# Patient Record
Sex: Female | Born: 1950 | Race: White | Hispanic: No | Marital: Married | Smoking: Former smoker
Health system: Southern US, Community
[De-identification: ages and names within clinical notes are randomized; demographics above are authoritative.]

## PROBLEM LIST (undated history)

## (undated) DIAGNOSIS — M1990 Unspecified osteoarthritis, unspecified site: Secondary | ICD-10-CM

## (undated) DIAGNOSIS — E785 Hyperlipidemia, unspecified: Secondary | ICD-10-CM

## (undated) DIAGNOSIS — N952 Postmenopausal atrophic vaginitis: Secondary | ICD-10-CM

## (undated) DIAGNOSIS — I10 Essential (primary) hypertension: Secondary | ICD-10-CM

## (undated) DIAGNOSIS — M8580 Other specified disorders of bone density and structure, unspecified site: Secondary | ICD-10-CM

## (undated) DIAGNOSIS — I493 Ventricular premature depolarization: Secondary | ICD-10-CM

## (undated) DIAGNOSIS — R011 Cardiac murmur, unspecified: Secondary | ICD-10-CM

## (undated) DIAGNOSIS — Z8669 Personal history of other diseases of the nervous system and sense organs: Secondary | ICD-10-CM

## (undated) DIAGNOSIS — M810 Age-related osteoporosis without current pathological fracture: Secondary | ICD-10-CM

## (undated) DIAGNOSIS — F419 Anxiety disorder, unspecified: Secondary | ICD-10-CM

## (undated) DIAGNOSIS — F4321 Adjustment disorder with depressed mood: Secondary | ICD-10-CM

## (undated) HISTORY — DX: Personal history of other diseases of the nervous system and sense organs: Z86.69

## (undated) HISTORY — DX: Hyperlipidemia, unspecified: E78.5

## (undated) HISTORY — DX: Adjustment disorder with depressed mood: F43.21

## (undated) HISTORY — DX: Other specified disorders of bone density and structure, unspecified site: M85.80

## (undated) HISTORY — DX: Unspecified osteoarthritis, unspecified site: M19.90

## (undated) HISTORY — DX: Essential (primary) hypertension: I10

## (undated) HISTORY — DX: Postmenopausal atrophic vaginitis: N95.2

## (undated) HISTORY — DX: Cardiac murmur, unspecified: R01.1

## (undated) HISTORY — DX: Anxiety disorder, unspecified: F41.9

## (undated) HISTORY — DX: Ventricular premature depolarization: I49.3

## (undated) HISTORY — DX: Age-related osteoporosis without current pathological fracture: M81.0

## (undated) HISTORY — PX: EYE SURGERY: SHX253

---
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
## 2009-06-30 DIAGNOSIS — F4321 Adjustment disorder with depressed mood: Secondary | ICD-10-CM

## 2009-06-30 HISTORY — DX: Adjustment disorder with depressed mood: F43.21

## 2011-12-29 HISTORY — PX: COLONOSCOPY: SHX174

## 2012-01-09 LAB — HM COLONOSCOPY: HM COLON: NORMAL

## 2012-09-28 DIAGNOSIS — M8580 Other specified disorders of bone density and structure, unspecified site: Secondary | ICD-10-CM

## 2012-09-28 HISTORY — DX: Other specified disorders of bone density and structure, unspecified site: M85.80

## 2012-09-28 HISTORY — PX: OTHER SURGICAL HISTORY: SHX169

## 2012-10-15 LAB — HM DEXA SCAN

## 2013-04-29 LAB — COMPREHENSIVE METABOLIC PANEL
ALT: 15
AST: 16 U/L
Alkaline Phosphatase: 45 U/L
Creat: 0.82
Glucose: 85
Potassium: 4.2 mmol/L
Sodium: 139 mmol/L (ref 137–147)
Total Bilirubin: 0.6 mg/dL

## 2013-04-29 LAB — LIPID PANEL
CHOLESTEROL, TOTAL: 164
HDL: 58 mg/dL (ref 35–70)
LDL (calc): 84
Triglycerides: 109

## 2013-04-29 LAB — TSH: TSH: 1.577

## 2013-04-29 LAB — CBC
HEMOGLOBIN: 13.2 g/dL
PLATELET COUNT: 300
WBC: 4.4

## 2013-05-11 ENCOUNTER — Other Ambulatory Visit: Payer: Self-pay | Admitting: Nurse Practitioner

## 2013-05-11 DIAGNOSIS — Z1231 Encounter for screening mammogram for malignant neoplasm of breast: Secondary | ICD-10-CM

## 2013-05-27 ENCOUNTER — Ambulatory Visit
Admission: RE | Admit: 2013-05-27 | Discharge: 2013-05-27 | Disposition: A | Discharge status: Home / Self Care | Payer: Federal, State, Local not specified - PPO | Source: Ambulatory Visit | Attending: Nurse Practitioner | Admitting: Nurse Practitioner

## 2013-05-27 DIAGNOSIS — Z1231 Encounter for screening mammogram for malignant neoplasm of breast: Secondary | ICD-10-CM

## 2014-05-01 ENCOUNTER — Ambulatory Visit: Payer: Federal, State, Local not specified - PPO | Admitting: Family Medicine

## 2014-05-04 ENCOUNTER — Ambulatory Visit (INDEPENDENT_AMBULATORY_CARE_PROVIDER_SITE_OTHER): Payer: Federal, State, Local not specified - PPO

## 2014-05-04 ENCOUNTER — Encounter: Payer: Self-pay | Admitting: Family Medicine

## 2014-05-04 ENCOUNTER — Ambulatory Visit (INDEPENDENT_AMBULATORY_CARE_PROVIDER_SITE_OTHER): Payer: Federal, State, Local not specified - PPO | Admitting: Family Medicine

## 2014-05-04 ENCOUNTER — Encounter (INDEPENDENT_AMBULATORY_CARE_PROVIDER_SITE_OTHER): Payer: Self-pay

## 2014-05-04 VITALS — BP 116/70 | HR 75 | Temp 99.0°F | Resp 14 | Ht 66.5 in | Wt 132.0 lb

## 2014-05-04 DIAGNOSIS — E785 Hyperlipidemia, unspecified: Secondary | ICD-10-CM

## 2014-05-04 DIAGNOSIS — I10 Essential (primary) hypertension: Secondary | ICD-10-CM

## 2014-05-04 DIAGNOSIS — M8580 Other specified disorders of bone density and structure, unspecified site: Secondary | ICD-10-CM

## 2014-05-04 DIAGNOSIS — Z23 Encounter for immunization: Secondary | ICD-10-CM

## 2014-05-04 MED ORDER — PRAVASTATIN SODIUM 40 MG PO TABS: 40.0000 mg | ORAL_TABLET | Freq: Every day | ORAL | Status: DC

## 2014-05-04 NOTE — Progress Notes (Signed)
Pre visit review using our clinic review tool, if applicable. No additional management support is needed unless otherwise documented below in the visit note. 

## 2014-05-04 NOTE — Assessment & Plan Note (Signed)
Very well controlled on pravastatin 40mg  daily based on last [place] 2014. Return fasting for recheck. Mainly taking 2/2 fam hx.

## 2014-05-04 NOTE — Assessment & Plan Note (Signed)
Chronic, very stable on current amlodipine 5mg  dose. Suggested decrease to 2.5mg  (pt will take 1/2 tab and monitor bp at home.)

## 2014-05-04 NOTE — Assessment & Plan Note (Signed)
Pt will check on latest DEXA and bring me copy (thinks 2013). Continues cal/vit d BID. S/p 5 yrs fosamax.

## 2014-05-04 NOTE — Progress Notes (Signed)
BP 116/70 mmHg  Pulse 75  Temp(Src) 99 F (37.2 C) (Oral)  Resp 14  Ht 5' 6.5" (1.689 m)  Wt 132 lb (59.875 kg)  BMI 20.99 kg/m2  SpO2 97%   CC: new pt to establish  Subjective:    Patient ID: Sheri Patel, female    DOB: 1950/11/17, 63 y.o.   MRN: [id number]  HPI: Sheri Patel is a 63 y.o. female presenting on 05/04/2014 for [place]   Recently moved from [place] with husband to be closer to grandchildren.  HTN - compliant with amlodipine 5mg  daily. [facility]. Notices bp dropping some wonders if still needs 5mg . No HA, vision changes, CP/tightness, SOB, leg swelling.  HLD - compliant with pravastatin 40mg  daily without myalgias. Brings labs from 04/2013 showing LFTs WNL, TC 164, trig 109, HDL 58, and LDL 84  H/o osteopenia - was on bisphosphonate for 5 years. Last dexa thinks 2013. Has lost 1.5 inches.  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  Preventative: Last blood work 1 yr ago Colonoscopy at age 89 and 32, normal (last 2012) Well woman exam 12/2012 GYN Dr Sheran Fava.  Would like to have GYN done at our office. 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  Allergies and medications reviewed and updated. No current outpatient prescriptions on file prior to visit.   No current facility-administered medications on file prior to visit.    Review of Systems Per HPI unless specifically indicated above    Objective:    BP 116/70 mmHg  Pulse 75  Temp(Src) 99 F (37.2 C) (Oral)  Resp 14  Ht 5' 6.5" (1.689 m)  Wt 132 lb (59.875 kg)  BMI 20.99 kg/m2  SpO2 97%  Physical Exam  Constitutional: She  appears well-developed and well-nourished. No distress.  HENT:  Head: Normocephalic and atraumatic.  Mouth/Throat: Oropharynx is clear and moist. No oropharyngeal exudate.  Eyes: Conjunctivae and EOM are normal. Pupils are equal, round, and reactive to light. No scleral icterus.  Neck: Normal range of motion. Neck supple. No thyromegaly present.  Cardiovascular: Normal rate, regular rhythm, normal heart sounds and intact distal pulses.   No murmur heard. Pulmonary/Chest: Effort normal and breath sounds normal. No respiratory distress. She has no wheezes. She has no rales.  Musculoskeletal: She exhibits no edema.  Lymphadenopathy:    She has no cervical adenopathy.  Skin: Skin is warm and dry. No rash noted.  Psychiatric: She has a normal mood and affect.  Nursing note and vitals reviewed.  No results found for this or any previous visit.    Assessment & Plan:   Problem List Items Addressed This Visit    Osteopenia    Pt will check on latest DEXA and bring me copy (thinks 2013). Continues cal/vit d BID. S/p 5 yrs fosamax.    Relevant Orders      Vit D  25 hydroxy (rtn osteoporosis monitoring)   Hypertension - Primary    Chronic, very stable on current amlodipine 5mg  dose. Suggested decrease to 2.5mg  (pt will take 1/2 tab and monitor bp at home.)    Relevant Medications      amLODipine (NORVASC) 5 MG  tablet      aspirin 81 MG tablet      pravastatin (PRAVACHOL) tablet   Other Relevant Orders      Comprehensive metabolic panel   Hyperlipidemia    Very well controlled on pravastatin 40mg  daily based on last [place] 2014. Return fasting for recheck. Mainly taking 2/2 fam hx.    Relevant Medications      amLODipine (NORVASC) 5 MG tablet      aspirin 81 MG tablet      pravastatin (PRAVACHOL) tablet   Other Relevant Orders      Lipid panel      Comprehensive metabolic panel       Follow up plan: Return in about 3 months (around 08/04/2014), or as needed, for annual exam, prior  fasting for blood work.

## 2014-05-04 NOTE — Patient Instructions (Addendum)
Flu shot today. Try lower amlodipine dose (1/2 tablet daily) and monitor blood pressures. Return as needed or in 3-4 months for physical.  Check on bone density scan at home. Good to meet you today, call us with questions.

## 2014-05-05 ENCOUNTER — Telehealth: Payer: Self-pay | Admitting: Family Medicine

## 2014-05-05 NOTE — Telephone Encounter (Signed)
emmi emailed °

## 2014-05-10 ENCOUNTER — Encounter: Payer: Self-pay | Admitting: *Deleted

## 2014-06-13 ENCOUNTER — Encounter: Payer: Self-pay | Admitting: Family Medicine

## 2014-06-21 ENCOUNTER — Telehealth: Payer: Self-pay

## 2014-06-21 NOTE — Telephone Encounter (Signed)
Pt saw Dr Darnell Level on 05/04/14 and Amlodipine 5 mg was decreased to 1/2 tab daily. Pt has been taking 1/2 tab daily and BP averaging 115/78. Pt request new rx of amlodipine 2.5 mg taking one daily to [facility].Please advise.

## 2014-06-22 MED ORDER — AMLODIPINE BESYLATE 2.5 MG PO TABS: 2.5000 mg | ORAL_TABLET | Freq: Every day | ORAL | Status: DC

## 2014-06-22 NOTE — Telephone Encounter (Signed)
Sent. Thanks.   

## 2014-07-05 ENCOUNTER — Other Ambulatory Visit: Payer: Self-pay

## 2014-07-05 DIAGNOSIS — Z1231 Encounter for screening mammogram for malignant neoplasm of breast: Secondary | ICD-10-CM

## 2014-07-06 ENCOUNTER — Encounter: Payer: Self-pay | Admitting: *Deleted

## 2014-07-06 ENCOUNTER — Ambulatory Visit
Admission: RE | Admit: 2014-07-06 | Discharge: 2014-07-06 | Disposition: A | Discharge status: Home / Self Care | Payer: Federal, State, Local not specified - PPO | Source: Ambulatory Visit

## 2014-07-06 DIAGNOSIS — Z1231 Encounter for screening mammogram for malignant neoplasm of breast: Secondary | ICD-10-CM

## 2014-07-06 LAB — HM MAMMOGRAPHY: HM MAMMO: NORMAL

## 2014-07-24 ENCOUNTER — Encounter: Payer: Self-pay | Admitting: Family Medicine

## 2014-08-07 ENCOUNTER — Other Ambulatory Visit: Payer: Federal, State, Local not specified - PPO

## 2014-08-08 ENCOUNTER — Other Ambulatory Visit (INDEPENDENT_AMBULATORY_CARE_PROVIDER_SITE_OTHER): Payer: Federal, State, Local not specified - PPO

## 2014-08-08 DIAGNOSIS — E785 Hyperlipidemia, unspecified: Secondary | ICD-10-CM

## 2014-08-08 DIAGNOSIS — I10 Essential (primary) hypertension: Secondary | ICD-10-CM

## 2014-08-08 DIAGNOSIS — M8580 Other specified disorders of bone density and structure, unspecified site: Secondary | ICD-10-CM

## 2014-08-08 LAB — COMPREHENSIVE METABOLIC PANEL
ALBUMIN: 4.2 g/dL (ref 3.5–5.2)
ALT: 15 U/L (ref 0–35)
AST: 15 U/L (ref 0–37)
Alkaline Phosphatase: 57 U/L (ref 39–117)
BILIRUBIN TOTAL: 0.5 mg/dL (ref 0.2–1.2)
BUN: 17 mg/dL (ref 6–23)
CO2: 31 meq/L (ref 19–32)
Calcium: 9.5 mg/dL (ref 8.4–10.5)
Chloride: 103 mEq/L (ref 96–112)
Creatinine, Ser: 0.95 mg/dL (ref 0.40–1.20)
GFR: 63.04 mL/min (ref >60.00)
GLUCOSE: 95 mg/dL (ref 70–99)
Potassium: 4 mEq/L (ref 3.5–5.1)
SODIUM: 140 meq/L (ref 135–145)
TOTAL PROTEIN: 6.9 g/dL (ref 6.0–8.3)

## 2014-08-08 LAB — LIPID PANEL
Cholesterol: 162 mg/dL (ref 0–200)
HDL: 65.3 mg/dL (ref >39.00)
LDL Cholesterol: 80 mg/dL (ref 0–99)
NONHDL: 96.7
Total CHOL/HDL Ratio: 2
Triglycerides: 84 mg/dL (ref 0.0–149.0)
VLDL: 16.8 mg/dL (ref 0.0–40.0)

## 2014-08-08 LAB — VITAMIN D 25 HYDROXY (VIT D DEFICIENCY, FRACTURES): VITD: 55.51 ng/mL (ref 30.00–100.00)

## 2014-08-14 ENCOUNTER — Ambulatory Visit (INDEPENDENT_AMBULATORY_CARE_PROVIDER_SITE_OTHER): Payer: Federal, State, Local not specified - PPO | Admitting: Family Medicine

## 2014-08-14 ENCOUNTER — Encounter: Payer: Self-pay | Admitting: Family Medicine

## 2014-08-14 ENCOUNTER — Other Ambulatory Visit (HOSPITAL_COMMUNITY)
Admission: RE | Admit: 2014-08-14 | Discharge: 2014-08-14 | Disposition: A | Discharge status: Home / Self Care | Payer: Federal, State, Local not specified - PPO | Source: Ambulatory Visit | Attending: Family Medicine | Admitting: Family Medicine

## 2014-08-14 VITALS — BP 126/84 | HR 64 | Temp 99.0°F | Ht 66.5 in | Wt 134.5 lb

## 2014-08-14 DIAGNOSIS — I10 Essential (primary) hypertension: Secondary | ICD-10-CM

## 2014-08-14 DIAGNOSIS — Z1151 Encounter for screening for human papillomavirus (HPV): Secondary | ICD-10-CM | POA: Diagnosis present

## 2014-08-14 DIAGNOSIS — Z124 Encounter for screening for malignant neoplasm of cervix: Secondary | ICD-10-CM

## 2014-08-14 DIAGNOSIS — Z01419 Encounter for gynecological examination (general) (routine) without abnormal findings: Secondary | ICD-10-CM | POA: Insufficient documentation

## 2014-08-14 DIAGNOSIS — M8580 Other specified disorders of bone density and structure, unspecified site: Secondary | ICD-10-CM

## 2014-08-14 DIAGNOSIS — Z8742 Personal history of other diseases of the female genital tract: Secondary | ICD-10-CM | POA: Insufficient documentation

## 2014-08-14 DIAGNOSIS — Z0000 Encounter for general adult medical examination without abnormal findings: Secondary | ICD-10-CM | POA: Insufficient documentation

## 2014-08-14 DIAGNOSIS — E785 Hyperlipidemia, unspecified: Secondary | ICD-10-CM

## 2014-08-14 LAB — HM PAP SMEAR: HM Pap smear: NORMAL

## 2014-08-14 NOTE — Progress Notes (Signed)
BP 126/84 mmHg  Pulse 64  Temp(Src) 99 F (37.2 C) (Oral)  Ht 5' 6.5" (1.689 m)  Wt 134 lb 8 oz (61.009 kg)  BMI 21.39 kg/m2   CC: CPE  Subjective:    Patient ID: Sheri Patel, female    DOB: 05-06-1951, 64 y.o.   MRN: [id number]  HPI: Sheri Patel is a 64 y.o. female presenting on 08/14/2014 for Annual Exam   Recently returned from [place] for 2 weeks.   Preventative: COLONOSCOPY Date: 12/2011 normal ([facility] in [place]) DEXA Date: 09/2012 T -1.9 at spine, -0.7 at hip, was on fosamax for 5 yrs, stopped 10/2013. Consider repeat next physical. Well woman exam 12/2012 GYN Dr Sheran Fava. Would like to have GYN done at our office. H/o abnormals in the past. 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 Interim medical history since our last visit reviewed. Allergies and medications reviewed and updated. Current Outpatient Prescriptions on File Prior to Visit  Medication Sig  . amLODipine (NORVASC) 2.5 MG tablet Take 1 tablet (2.5 mg total) by mouth daily.  Mar[place] Kitchen aspirin 81 MG tablet Take 81 mg by mouth every Monday, Wednesday, and Friday.   . Calcium Carb-Cholecalciferol (CALCIUM-VITAMIN D3) 600-400 MG-UNIT CAPS Take 1 capsule by mouth 2 (two) times daily.  . cycloSPORINE (RESTASIS) 0.05 % ophthalmic emulsion Place 1 drop into both eyes 2 (two) times daily.  . pravastatin (PRAVACHOL) 40 MG tablet Take 1 tablet (40 mg total) by mouth at bedtime.   No current facility-administered medications on file prior to visit.    Review of Systems  Constitutional: Negative for fever, chills, activity change, appetite change, fatigue and unexpected weight change.  HENT:  Negative for hearing loss.   Eyes: Negative for visual disturbance.  Respiratory: Negative for cough, chest tightness, shortness of breath and wheezing.   Cardiovascular: Negative for chest pain, palpitations and leg swelling.  Gastrointestinal: Negative for nausea, vomiting, abdominal pain, diarrhea, constipation, blood in stool and abdominal distention.  Genitourinary: Negative for hematuria and difficulty urinating.  Musculoskeletal: Negative for myalgias, arthralgias and neck pain.  Skin: Negative for rash.  Neurological: Negative for dizziness, seizures, syncope and headaches.  Hematological: Negative for adenopathy. Does not bruise/bleed easily.  Psychiatric/Behavioral: Negative for dysphoric mood. The patient is not nervous/anxious.    Per HPI unless specifically indicated above     Objective:    BP 126/84 mmHg  Pulse 64  Temp(Src) 99 F (37.2 C) (Oral)  Ht 5' 6.5" (1.689 m)  Wt 134 lb 8 oz (61.009 kg)  BMI 21.39 kg/m2  Wt Readings from Last 3 Encounters:  08/14/14 134 lb 8 oz (61.009 kg)  05/04/14 132 lb (59.875 kg)    Physical Exam  Constitutional: She is oriented to person, place, and time. She appears well-developed and well-nourished. No distress.  HENT:  Head: Normocephalic and atraumatic.  Right Ear: Hearing, tympanic membrane, external ear and ear canal normal.  Left Ear: Hearing, tympanic membrane, external ear and ear canal normal.  Nose: Nose normal.  Mouth/Throat: Uvula is midline, oropharynx is clear and moist and mucous membranes are normal. No oropharyngeal exudate, posterior oropharyngeal edema or posterior oropharyngeal erythema.  Eyes: Conjunctivae and EOM are normal. Pupils are equal, round,  and reactive to light. No scleral icterus.  Neck: Normal range of motion. Neck supple. Carotid bruit is not present. No thyromegaly present.  Cardiovascular: Normal rate, regular rhythm, normal heart sounds and intact distal pulses.   No murmur heard. Pulses:       Radial pulses are 2+ on the right side, and 2+ on the left side.  Pulmonary/Chest: Effort normal and breath sounds normal. No respiratory distress. She has no wheezes. She has no rales. Right breast exhibits no inverted nipple, no mass, no nipple discharge and no skin change. Left breast exhibits no inverted nipple, no mass, no nipple discharge and no skin change.  Abdominal: Soft. Bowel sounds are normal. She exhibits no distension and no mass. There is no tenderness. There is no rebound and no guarding.  Genitourinary: Vagina normal and uterus normal. Pelvic exam was performed with patient supine. There is no rash, tenderness, lesion or injury on the right labia. There is no rash, tenderness, lesion or injury on the left labia. Cervix exhibits friability. Cervix exhibits no motion tenderness. Right adnexum displays no mass, no tenderness and no fullness. Left adnexum displays no mass, no tenderness and no fullness.  Pap performed  Musculoskeletal: Normal range of motion. She exhibits no edema.  Lymphadenopathy:       Head (right side): No submental, no submandibular, no tonsillar, no preauricular and no posterior auricular adenopathy present.       Head (left side): No submental, no submandibular, no tonsillar, no preauricular and no posterior auricular adenopathy present.    She has no cervical adenopathy.    She has no axillary adenopathy.       Right axillary: No lateral adenopathy present.       Left axillary: No lateral adenopathy present.      Right: No supraclavicular adenopathy present.       Left: No supraclavicular adenopathy present.  Neurological: She is alert and oriented to person, place, and time.  CN grossly intact, station and gait intact  Skin: Skin is warm and dry. No rash noted.  Psychiatric: She has a normal mood and affect. Her behavior is normal. Judgment and thought content normal.  Nursing note and vitals reviewed.  Results for orders placed or performed in visit on  08/14/14  HM DEXA SCAN  Result Value Ref Range   HM Dexa Scan osteopenia       Assessment & Plan:   Problem List Items Addressed This Visit    Osteopenia    Reviewed #s. Check DEXA next year (2017). She does have h/o bisphosphonate use x 5 years, stopped 10/2013. She does regularly take calcium/vit D and participates in regular weight bearing exercises.      Hypertension    Chronic, stable. Continue lower dose amlodipine 2.5mg  daily regimen.      Hyperlipidemia    Well controlled on pravastatin 40mg  daily. Continue. Strong fmhx CAD.      History of abnormal cervical Pap smear    Recheck today.      Health maintenance examination - Primary    Preventative protocols reviewed and updated unless pt declined. Discussed healthy diet and lifestyle.  Breast exam, pelvic and pap smear today. H/o abnormals.          Follow up plan: Return in about 1 year (around 08/15/2015), or as needed, for annual exam, prior fasting for blood work.

## 2014-08-14 NOTE — Assessment & Plan Note (Signed)
Recheck today. 

## 2014-08-14 NOTE — Assessment & Plan Note (Addendum)
Well controlled on pravastatin 40mg  daily. Continue. Strong fmhx CAD.

## 2014-08-14 NOTE — Progress Notes (Signed)
Pre visit review using our clinic review tool, if applicable. No additional management support is needed unless otherwise documented below in the visit note. 

## 2014-08-14 NOTE — Assessment & Plan Note (Addendum)
Chronic, stable. Continue lower dose amlodipine 2.5mg  daily regimen.

## 2014-08-14 NOTE — Assessment & Plan Note (Signed)
Reviewed #s. Check DEXA next year (2017). She does have h/o bisphosphonate use x 5 years, stopped 10/2013. She does regularly take calcium/vit D and participates in regular weight bearing exercises.

## 2014-08-14 NOTE — Assessment & Plan Note (Addendum)
Preventative protocols reviewed and updated unless pt declined. Discussed healthy diet and lifestyle.  Breast exam, pelvic and pap smear today. H/o abnormals.

## 2014-08-14 NOTE — Addendum Note (Signed)
Addended by: Royann Shivers A on: 08/14/2014 10:59 AM   Modules accepted: Orders

## 2014-08-14 NOTE — Patient Instructions (Signed)
Good to see you today, call us with questions. Return as needed or in 1 year for next physical.  Sign release for records from prior GYN and prior pap smears up front. Continue calcium and vitamin D. We will discuss repeat DEXA next year. Continue weight bearing exercises.

## 2014-08-16 LAB — CYTOLOGY - PAP

## 2014-08-17 ENCOUNTER — Encounter: Payer: Self-pay | Admitting: *Deleted

## 2014-08-29 DIAGNOSIS — N952 Postmenopausal atrophic vaginitis: Secondary | ICD-10-CM

## 2014-08-29 HISTORY — DX: Postmenopausal atrophic vaginitis: N95.2

## 2014-10-02 ENCOUNTER — Encounter: Payer: Self-pay | Admitting: Family Medicine

## 2014-11-12 ENCOUNTER — Encounter: Payer: Self-pay | Admitting: Family Medicine

## 2014-11-12 DIAGNOSIS — Z7189 Other specified counseling: Secondary | ICD-10-CM | POA: Insufficient documentation

## 2015-01-19 ENCOUNTER — Other Ambulatory Visit: Payer: Self-pay | Admitting: Family Medicine

## 2015-07-02 ENCOUNTER — Other Ambulatory Visit: Payer: Self-pay | Admitting: Family Medicine

## 2015-07-16 ENCOUNTER — Other Ambulatory Visit: Payer: Self-pay

## 2015-07-16 DIAGNOSIS — Z1231 Encounter for screening mammogram for malignant neoplasm of breast: Secondary | ICD-10-CM

## 2015-08-02 ENCOUNTER — Ambulatory Visit
Admission: RE | Admit: 2015-08-02 | Discharge: 2015-08-02 | Disposition: A | Discharge status: Home / Self Care | Payer: Federal, State, Local not specified - PPO | Source: Ambulatory Visit

## 2015-08-02 DIAGNOSIS — Z1231 Encounter for screening mammogram for malignant neoplasm of breast: Secondary | ICD-10-CM

## 2015-08-03 LAB — HM MAMMOGRAPHY: HM MAMMO: NORMAL

## 2015-08-06 ENCOUNTER — Encounter: Payer: Self-pay | Admitting: *Deleted

## 2015-08-07 ENCOUNTER — Other Ambulatory Visit: Payer: Self-pay | Admitting: *Deleted

## 2015-08-07 MED ORDER — AMLODIPINE BESYLATE 2.5 MG PO TABS: 2.5000 mg | ORAL_TABLET | Freq: Every day | ORAL | Status: DC

## 2015-08-14 ENCOUNTER — Other Ambulatory Visit: Payer: Self-pay | Admitting: Family Medicine

## 2015-08-16 ENCOUNTER — Other Ambulatory Visit: Payer: Federal, State, Local not specified - PPO

## 2015-08-20 ENCOUNTER — Encounter: Payer: Federal, State, Local not specified - PPO | Admitting: Family Medicine

## 2015-09-05 ENCOUNTER — Other Ambulatory Visit: Payer: Self-pay | Admitting: Family Medicine

## 2015-09-05 DIAGNOSIS — I10 Essential (primary) hypertension: Secondary | ICD-10-CM

## 2015-09-05 DIAGNOSIS — E785 Hyperlipidemia, unspecified: Secondary | ICD-10-CM

## 2015-09-06 ENCOUNTER — Other Ambulatory Visit (INDEPENDENT_AMBULATORY_CARE_PROVIDER_SITE_OTHER): Payer: Federal, State, Local not specified - PPO

## 2015-09-06 DIAGNOSIS — I10 Essential (primary) hypertension: Secondary | ICD-10-CM

## 2015-09-06 DIAGNOSIS — E785 Hyperlipidemia, unspecified: Secondary | ICD-10-CM

## 2015-09-06 LAB — BASIC METABOLIC PANEL
BUN: 18 mg/dL (ref 6–23)
CALCIUM: 9.5 mg/dL (ref 8.4–10.5)
CO2: 31 meq/L (ref 19–32)
CREATININE: 0.79 mg/dL (ref 0.40–1.20)
Chloride: 104 mEq/L (ref 96–112)
GFR: 77.73 mL/min (ref >60.00)
GLUCOSE: 100 mg/dL — AB (ref 70–99)
Potassium: 4.1 mEq/L (ref 3.5–5.1)
Sodium: 141 mEq/L (ref 135–145)

## 2015-09-06 LAB — LIPID PANEL
CHOLESTEROL: 174 mg/dL (ref 0–200)
HDL: 68.9 mg/dL (ref >39.00)
LDL Cholesterol: 84 mg/dL (ref 0–99)
NonHDL: 105.24
TRIGLYCERIDES: 105 mg/dL (ref 0.0–149.0)
Total CHOL/HDL Ratio: 3
VLDL: 21 mg/dL (ref 0.0–40.0)

## 2015-09-06 LAB — HEPATIC FUNCTION PANEL
ALBUMIN: 4.4 g/dL (ref 3.5–5.2)
ALK PHOS: 48 U/L (ref 39–117)
ALT: 15 U/L (ref 0–35)
AST: 15 U/L (ref 0–37)
Bilirubin, Direct: 0.1 mg/dL (ref 0.0–0.3)
Total Bilirubin: 0.4 mg/dL (ref 0.2–1.2)
Total Protein: 7 g/dL (ref 6.0–8.3)

## 2015-09-13 ENCOUNTER — Encounter: Payer: Self-pay | Admitting: Family Medicine

## 2015-09-13 ENCOUNTER — Ambulatory Visit (INDEPENDENT_AMBULATORY_CARE_PROVIDER_SITE_OTHER): Payer: Federal, State, Local not specified - PPO | Admitting: Family Medicine

## 2015-09-13 ENCOUNTER — Other Ambulatory Visit (HOSPITAL_COMMUNITY)
Admission: RE | Admit: 2015-09-13 | Discharge: 2015-09-13 | Disposition: A | Discharge status: Home / Self Care | Payer: Federal, State, Local not specified - PPO | Source: Ambulatory Visit | Attending: Family Medicine | Admitting: Family Medicine

## 2015-09-13 VITALS — BP 126/84 | HR 68 | Temp 98.7°F | Wt 132.8 lb

## 2015-09-13 DIAGNOSIS — M8580 Other specified disorders of bone density and structure, unspecified site: Secondary | ICD-10-CM | POA: Diagnosis not present

## 2015-09-13 DIAGNOSIS — I358 Other nonrheumatic aortic valve disorders: Secondary | ICD-10-CM | POA: Insufficient documentation

## 2015-09-13 DIAGNOSIS — Z0000 Encounter for general adult medical examination without abnormal findings: Secondary | ICD-10-CM

## 2015-09-13 DIAGNOSIS — R011 Cardiac murmur, unspecified: Secondary | ICD-10-CM

## 2015-09-13 DIAGNOSIS — I350 Nonrheumatic aortic (valve) stenosis: Secondary | ICD-10-CM | POA: Insufficient documentation

## 2015-09-13 DIAGNOSIS — I10 Essential (primary) hypertension: Secondary | ICD-10-CM | POA: Diagnosis not present

## 2015-09-13 DIAGNOSIS — Z124 Encounter for screening for malignant neoplasm of cervix: Secondary | ICD-10-CM

## 2015-09-13 DIAGNOSIS — Z01419 Encounter for gynecological examination (general) (routine) without abnormal findings: Secondary | ICD-10-CM | POA: Insufficient documentation

## 2015-09-13 DIAGNOSIS — E785 Hyperlipidemia, unspecified: Secondary | ICD-10-CM

## 2015-09-13 DIAGNOSIS — Z7189 Other specified counseling: Secondary | ICD-10-CM

## 2015-09-13 MED ORDER — AMLODIPINE BESYLATE 2.5 MG PO TABS: 2.5000 mg | ORAL_TABLET | Freq: Every day | ORAL | Status: DC

## 2015-09-13 MED ORDER — PRAVASTATIN SODIUM 40 MG PO TABS: 40.0000 mg | ORAL_TABLET | Freq: Every day | ORAL | Status: DC

## 2015-09-13 NOTE — Assessment & Plan Note (Addendum)
Chronic, stable. Continue current regimen. Strong fmhx CAD

## 2015-09-13 NOTE — Addendum Note (Signed)
Addended by: Royann Shivers A on: 09/13/2015 10:48 AM   Modules accepted: Orders

## 2015-09-13 NOTE — Addendum Note (Signed)
Addended by: Ria Bush on: 09/13/2015 11:48 AM   Modules accepted: Miquel Dunn

## 2015-09-13 NOTE — Assessment & Plan Note (Signed)
Preventative protocols reviewed and updated unless pt declined. Discussed healthy diet and lifestyle.  

## 2015-09-13 NOTE — Assessment & Plan Note (Signed)
Chronic, stable. Continue current regimen. 

## 2015-09-13 NOTE — Progress Notes (Signed)
Pre visit review using our clinic review tool, if applicable. No additional management support is needed unless otherwise documented below in the visit note. 

## 2015-09-13 NOTE — Assessment & Plan Note (Addendum)
Reviewed excellent dietary calcium intake - >1200mg /day. Ok to stop calcium supplement, start vit D 1000 IU daily.

## 2015-09-13 NOTE — Assessment & Plan Note (Signed)
?  new, mild. Will continue to monitor.

## 2015-09-13 NOTE — Progress Notes (Addendum)
BP 126/84 mmHg  Pulse 68  Temp(Src) 98.7 F (37.1 C) (Oral)  Wt 132 lb 12 oz (60.215 kg)   CC: CPE  Subjective:    Patient ID: Sheri Patel, female    DOB: [birth year], 65 y.o.   MRN: [id number]  HPI: Sheri Patel is a 65 y.o. female presenting on 09/13/2015 for Annual Exam   Retired fall 2016. Some adjustment to retired life - earlier that she would have liked.  Preventative: COLONOSCOPY Date: 12/2011 normal ([facility] in [place]) DEXA Date: 09/2012 T -1.9 at spine, -0.7 at hip, was on fosamax for 5 yrs, stopped 10/2013. Will repeat next physical.  Well woman exam 12/2012 GYN Dr Sheran Fava.Would like to have GYN done at our office. H/o abnormals in the past.  Mammogram 08/2015 WNL. Self breast exams at home.  Flu shot - yearly Td 2010 zostavax - 2012 Advanced planning: In chart - HCPOA is husband Richard then [place]. Does not want prolonged life support (10/2014) Seat belt use discussd Sunscreen use discussed. No changing moles on skin. 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 S/p 5FU treatment to nose.  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 Interim medical history since our last visit reviewed. Allergies and medications reviewed and updated. Current Outpatient Prescriptions on File Prior to Visit  Medication Sig  . aspirin 81 MG tablet Take 81 mg by mouth every Monday, Wednesday, and Friday.   . cycloSPORINE (RESTASIS) 0.05 % ophthalmic emulsion Place 1 drop into both eyes 2 (two) times daily.   No current facility-administered medications on file prior to visit.    Review of Systems  Constitutional: Negative for fever, chills, activity change, appetite change, fatigue and unexpected weight change.  HENT: Negative for hearing loss.     Eyes: Negative for visual disturbance.  Respiratory: Negative for cough, chest tightness, shortness of breath and wheezing.   Cardiovascular: Negative for chest pain, palpitations and leg swelling.  Gastrointestinal: Negative for nausea, vomiting, abdominal pain, diarrhea, constipation, blood in stool and abdominal distention.  Genitourinary: Negative for hematuria and difficulty urinating.  Musculoskeletal: Negative for myalgias, arthralgias and neck pain.  Skin: Negative for rash.  Neurological: Negative for dizziness, seizures, syncope and headaches.  Hematological: Negative for adenopathy. Does not bruise/bleed easily.  Psychiatric/Behavioral: Negative for dysphoric mood. The patient is not nervous/anxious.    Per HPI unless specifically indicated in ROS section     Objective:    BP 126/84 mmHg  Pulse 68  Temp(Src) 98.7 F (37.1 C) (Oral)  Wt 132 lb 12 oz (60.215 kg)  Wt Readings from Last 3 Encounters:  09/13/15 132 lb 12 oz (60.215 kg)  08/14/14 134 lb 8 oz (61.009 kg)  05/04/14 132 lb (59.875 kg)    Physical Exam  Constitutional: She is oriented to person, place, and time. She appears well-developed and well-nourished. No distress.  HENT:  Head: Normocephalic and atraumatic.  Right Ear: Hearing, tympanic membrane, external ear and ear canal normal.  Left Ear: Hearing, tympanic membrane, external ear and ear canal normal.  Nose: Nose normal.  Mouth/Throat: Uvula is midline, oropharynx is clear and moist and mucous membranes are normal. No oropharyngeal exudate, posterior oropharyngeal edema or posterior oropharyngeal erythema.  Eyes: Conjunctivae and EOM are normal. Pupils are equal, round, and reactive to light. No scleral icterus.  Neck: Normal range of motion. Neck supple. No thyromegaly present.  Cardiovascular: Normal rate, regular rhythm and intact distal pulses.   Murmur (2/6 systolic at LUSB) heard. Pulses:      Radial pulses are 2+ on the right side, and 2+ on  the left side.  Pulmonary/Chest: Effort normal and breath sounds normal. No respiratory distress. She has no wheezes. She has no rales.  Abdominal: Soft. Bowel sounds are normal. She exhibits no distension and no mass. There is no tenderness. There is no rebound and no guarding.  Genitourinary: Vagina normal and uterus normal. Pelvic exam was performed with patient supine. There is no rash, tenderness or lesion on the right labia. There is no rash, tenderness or lesion on the left labia. Cervix exhibits no motion tenderness, no discharge and no friability. Right adnexum displays no mass, no tenderness and no fullness. Left adnexum displays no mass, no tenderness and no fullness.  Musculoskeletal: Normal range of motion. She exhibits no edema.  Lymphadenopathy:    She has no cervical adenopathy.  Neurological: She is alert and oriented to person, place, and time.  CN grossly intact, station and gait intact  Skin: Skin is warm and dry. No rash noted.  Psychiatric: She has a normal mood and affect. Her behavior is normal. Judgment and thought content normal.  Nursing note and vitals reviewed.  Results for orders placed or performed in visit on 09/06/15  Lipid panel  Result Value Ref Range   Cholesterol 174 0 - 200 mg/dL   Triglycerides 105.0 0.0 - 149.0 mg/dL   HDL 68.90 >39.00 mg/dL   VLDL 21.0 0.0 - 40.0 mg/dL   LDL Cholesterol 84 0 - 99 mg/dL   Total CHOL/HDL Ratio 3    NonHDL 99991111   Basic metabolic panel  Result Value Ref Range   Sodium 141 135 - 145 mEq/L   Potassium 4.1 3.5 - 5.1 mEq/L   Chloride 104 96 - 112 mEq/L   CO2 31 19 - 32 mEq/L   Glucose, Bld 100 (H) 70 - 99 mg/dL   BUN 18 6 - 23 mg/dL   Creatinine, Ser 0.79 0.40 - 1.20 mg/dL   Calcium 9.5 8.4 - 10.5 mg/dL   GFR 77.73 >60.00 mL/min  Hepatic function panel  Result Value Ref Range   Total Bilirubin 0.4 0.2 - 1.2 mg/dL   Bilirubin, Direct 0.1 0.0 - 0.3 mg/dL   Alkaline Phosphatase 48 39 - 117 U/L   AST 15 0 - 37 U/L    ALT 15 0 - 35 U/L   Total Protein 7.0 6.0 - 8.3 g/dL   Albumin 4.4 3.5 - 5.2 g/dL      Assessment & Plan:   Problem List Items Addressed This Visit    Systolic murmur    ?new, mild. Will continue to monitor.      Osteopenia    Reviewed excellent dietary calcium intake - >1200mg /day. Ok to stop calcium supplement, start vit D 1000 IU daily.      Hypertension    Chronic, stable. Continue current regimen.      Relevant Medications   amLODipine (NORVASC) 2.5 MG tablet   pravastatin (PRAVACHOL) 40 MG tablet   Hyperlipidemia    Chronic, stable. Continue current regimen. Strong fmhx CAD      Relevant Medications   amLODipine (NORVASC) 2.5 MG tablet   pravastatin (PRAVACHOL) 40 MG tablet   Health maintenance examination - Primary    Preventative protocols reviewed and updated unless pt declined.  Discussed healthy diet and lifestyle.       Relevant Orders   Cytology - PAP   Advanced care planning/counseling discussion    In chart - HCPOA is husband Richard then [employer]. Does not want prolonged life support (10/2014)       Other Visit Diagnoses    Pap smear for cervical cancer screening        Relevant Orders    Cytology - PAP        Follow up plan: Return in about 1 year (around 09/12/2016), or as needed, for medicare wellness visit.

## 2015-09-13 NOTE — Assessment & Plan Note (Signed)
In chart - HCPOA is husband Richard then [place]. Does not want prolonged life support (10/2014)

## 2015-09-13 NOTE — Patient Instructions (Addendum)
Pelvic exam and pap smear performed today. You are doing well today. Return as needed or in 1 year for next physical - if medicare it will be a welcome to medicare visit.  Health Maintenance, Female Adopting a healthy lifestyle and getting preventive care can go a long way to promote health and wellness. Talk with your health care provider about what schedule of regular examinations is right for you. This is a good chance for you to check in with your provider about disease prevention and staying healthy. In between checkups, there are plenty of things you can do on your own. Experts have done a lot of research about which lifestyle changes and preventive measures are most likely to keep you healthy. Ask your health care provider for more information. WEIGHT AND DIET  Eat a healthy diet  Be sure to include plenty of vegetables, fruits, low-fat dairy products, and lean protein.  Do not eat a lot of foods high in solid fats, added sugars, or salt.  Get regular exercise. This is one of the most important things you can do for your health.  Most adults should exercise for at least 150 minutes each week. The exercise should increase your heart rate and make you sweat (moderate-intensity exercise).  Most adults should also do strengthening exercises at least twice a week. This is in addition to the moderate-intensity exercise.  Maintain a healthy weight  Body mass index (BMI) is a measurement that can be used to identify possible weight problems. It estimates body fat based on height and weight. Your health care provider can help determine your BMI and help you achieve or maintain a healthy weight.  For females 73 years of age and older:   A BMI below 18.5 is considered underweight.  A BMI of 18.5 to 24.9 is normal.  A BMI of 25 to 29.9 is considered overweight.  A BMI of 30 and above is considered obese.  Watch levels of cholesterol and blood lipids  You should start having your blood  tested for lipids and cholesterol at 65 years of age, then have this test every 5 years.  You may need to have your cholesterol levels checked more often if:  Your lipid or cholesterol levels are high.  You are older than 65 years of age.  You are at high risk for heart disease.  CANCER SCREENING   Lung Cancer  Lung cancer screening is recommended for adults 2-65 years old who are at high risk for lung cancer because of a history of smoking.  A yearly low-dose CT scan of the lungs is recommended for people who:  Currently smoke.  Have quit within the past 15 years.  Have at least a 30-pack-year history of smoking. A pack year is smoking an average of one pack of cigarettes a day for 1 year.  Yearly screening should continue until it has been 15 years since you quit.  Yearly screening should stop if you develop a health problem that would prevent you from having lung cancer treatment.  Breast Cancer  Practice breast self-awareness. This means understanding how your breasts normally appear and feel.  It also means doing regular breast self-exams. Let your health care provider know about any changes, no matter how small.  If you are in your 20s or 30s, you should have a clinical breast exam (CBE) by a health care provider every 1-3 years as part of a regular health exam.  If you are 82 or older, have a  CBE every year. Also consider having a breast X-ray (mammogram) every year.  If you have a family history of breast cancer, talk to your health care provider about genetic screening.  If you are at high risk for breast cancer, talk to your health care provider about having an MRI and a mammogram every year.  Breast cancer gene (BRCA) assessment is recommended for women who have family members with BRCA-related cancers. BRCA-related cancers include:  Breast.  Ovarian.  Tubal.  Peritoneal cancers.  Results of the assessment will determine the need for genetic counseling  and BRCA1 and BRCA2 testing. Cervical Cancer Your health care provider may recommend that you be screened regularly for cancer of the pelvic organs (ovaries, uterus, and vagina). This screening involves a pelvic examination, including checking for microscopic changes to the surface of your cervix (Pap test). You may be encouraged to have this screening done every 3 years, beginning at age 41.  For women ages 36-65, health care providers may recommend pelvic exams and Pap testing every 3 years, or they may recommend the Pap and pelvic exam, combined with testing for human papilloma virus (HPV), every 5 years. Some types of HPV increase your risk of cervical cancer. Testing for HPV may also be done on women of any age with unclear Pap test results.  Other health care providers may not recommend any screening for nonpregnant women who are considered low risk for pelvic cancer and who do not have symptoms. Ask your health care provider if a screening pelvic exam is right for you.  If you have had past treatment for cervical cancer or a condition that could lead to cancer, you need Pap tests and screening for cancer for at least 20 years after your treatment. If Pap tests have been discontinued, your risk factors (such as having a new sexual partner) need to be reassessed to determine if screening should resume. Some women have medical problems that increase the chance of getting cervical cancer. In these cases, your health care provider may recommend more frequent screening and Pap tests. Colorectal Cancer  This type of cancer can be detected and often prevented.  Routine colorectal cancer screening usually begins at 64 years of age and continues through 65 years of age.  Your health care provider may recommend screening at an earlier age if you have risk factors for colon cancer.  Your health care provider may also recommend using home test kits to check for hidden blood in the stool.  A small camera  at the end of a tube can be used to examine your colon directly (sigmoidoscopy or colonoscopy). This is done to check for the earliest forms of colorectal cancer.  Routine screening usually begins at age 52.  Direct examination of the colon should be repeated every 5-10 years through 65 years of age. However, you may need to be screened more often if early forms of precancerous polyps or small growths are found. Skin Cancer  Check your skin from head to toe regularly.  Tell your health care provider about any new moles or changes in moles, especially if there is a change in a mole's shape or color.  Also tell your health care provider if you have a mole that is larger than the size of a pencil eraser.  Always use sunscreen. Apply sunscreen liberally and repeatedly throughout the day.  Protect yourself by wearing long sleeves, pants, a wide-brimmed hat, and sunglasses whenever you are outside. HEART DISEASE, DIABETES, AND HIGH BLOOD  PRESSURE   High blood pressure causes heart disease and increases the risk of stroke. High blood pressure is more likely to develop in:  People who have blood pressure in the high end of the normal range (130-139/85-89 mm Hg).  People who are overweight or obese.  People who are African American.  If you are 18-39 years of age, have your blood pressure checked every 3-5 years. If you are 40 years of age or older, have your blood pressure checked every year. You should have your blood pressure measured twice--once when you are at a hospital or clinic, and once when you are not at a hospital or clinic. Record the average of the two measurements. To check your blood pressure when you are not at a hospital or clinic, you can use:  An automated blood pressure machine at a pharmacy.  A home blood pressure monitor.  If you are between 55 years and 79 years old, ask your health care provider if you should take aspirin to prevent strokes.  Have regular diabetes  screenings. This involves taking a blood sample to check your fasting blood sugar level.  If you are at a normal weight and have a low risk for diabetes, have this test once every three years after 65 years of age.  If you are overweight and have a high risk for diabetes, consider being tested at a younger age or more often. PREVENTING INFECTION  Hepatitis B  If you have a higher risk for hepatitis B, you should be screened for this virus. You are considered at high risk for hepatitis B if:  You were born in a country where hepatitis B is common. Ask your health care provider which countries are considered high risk.  Your parents were born in a high-risk country, and you have not been immunized against hepatitis B (hepatitis B vaccine).  You have HIV or AIDS.  You use needles to inject street drugs.  You live with someone who has hepatitis B.  You have had sex with someone who has hepatitis B.  You get hemodialysis treatment.  You take certain medicines for conditions, including cancer, organ transplantation, and autoimmune conditions. Hepatitis C  Blood testing is recommended for:  Everyone born from 1945 through 1965.  Anyone with known risk factors for hepatitis C. Sexually transmitted infections (STIs)  You should be screened for sexually transmitted infections (STIs) including gonorrhea and chlamydia if:  You are sexually active and are younger than 65 years of age.  You are older than 65 years of age and your health care provider tells you that you are at risk for this type of infection.  Your sexual activity has changed since you were last screened and you are at an increased risk for chlamydia or gonorrhea. Ask your health care provider if you are at risk.  If you do not have HIV, but are at risk, it may be recommended that you take a prescription medicine daily to prevent HIV infection. This is called pre-exposure prophylaxis (PrEP). You are considered at risk  if:  You are sexually active and do not regularly use condoms or know the HIV status of your partner(s).  You take drugs by injection.  You are sexually active with a partner who has HIV. Talk with your health care provider about whether you are at high risk of being infected with HIV. If you choose to begin PrEP, you should first be tested for HIV. You should then be tested every 3   months for as long as you are taking PrEP.  PREGNANCY   If you are premenopausal and you may become pregnant, ask your health care provider about preconception counseling.  If you may become pregnant, take 400 to 800 micrograms (mcg) of folic acid every day.  If you want to prevent pregnancy, talk to your health care provider about birth control (contraception). OSTEOPOROSIS AND MENOPAUSE   Osteoporosis is a disease in which the bones lose minerals and strength with aging. This can result in serious bone fractures. Your risk for osteoporosis can be identified using a bone density scan.  If you are 47 years of age or older, or if you are at risk for osteoporosis and fractures, ask your health care provider if you should be screened.  Ask your health care provider whether you should take a calcium or vitamin D supplement to lower your risk for osteoporosis.  Menopause may have certain physical symptoms and risks.  Hormone replacement therapy may reduce some of these symptoms and risks. Talk to your health care provider about whether hormone replacement therapy is right for you.  HOME CARE INSTRUCTIONS   Schedule regular health, dental, and eye exams.  Stay current with your immunizations.   Do not use any tobacco products including cigarettes, chewing tobacco, or electronic cigarettes.  If you are pregnant, do not drink alcohol.  If you are breastfeeding, limit how much and how often you drink alcohol.  Limit alcohol intake to no more than 1 drink per day for nonpregnant women. One drink equals 12  ounces of beer, 5 ounces of wine, or 1 ounces of hard liquor.  Do not use street drugs.  Do not share needles.  Ask your health care provider for help if you need support or information about quitting drugs.  Tell your health care provider if you often feel depressed.  Tell your health care provider if you have ever been abused or do not feel safe at home.   This information is not intended to replace advice given to you by your health care provider. Make sure you discuss any questions you have with your health care provider.   Document Released: 12/30/2010 Document Revised: 07/07/2014 Document Reviewed: 05/18/2013 Elsevier Interactive Patient Education [employer].

## 2015-09-14 LAB — CYTOLOGY - PAP

## 2015-11-07 ENCOUNTER — Encounter: Payer: Self-pay | Admitting: Family Medicine

## 2015-11-07 MED ORDER — ESCITALOPRAM OXALATE 10 MG PO TABS: 10.0000 mg | ORAL_TABLET | Freq: Every day | ORAL | Status: DC

## 2016-02-01 DIAGNOSIS — H04123 Dry eye syndrome of bilateral lacrimal glands: Secondary | ICD-10-CM | POA: Diagnosis not present

## 2016-02-01 DIAGNOSIS — H2513 Age-related nuclear cataract, bilateral: Secondary | ICD-10-CM | POA: Diagnosis not present

## 2016-02-01 DIAGNOSIS — H43813 Vitreous degeneration, bilateral: Secondary | ICD-10-CM | POA: Diagnosis not present

## 2016-02-01 DIAGNOSIS — Z9889 Other specified postprocedural states: Secondary | ICD-10-CM | POA: Diagnosis not present

## 2016-05-08 DIAGNOSIS — Z23 Encounter for immunization: Secondary | ICD-10-CM | POA: Diagnosis not present

## 2016-06-16 ENCOUNTER — Other Ambulatory Visit: Payer: Self-pay | Admitting: Family Medicine

## 2016-07-08 ENCOUNTER — Other Ambulatory Visit: Payer: Self-pay | Admitting: Family Medicine

## 2016-09-05 ENCOUNTER — Other Ambulatory Visit: Payer: Self-pay | Admitting: Family Medicine

## 2016-09-05 DIAGNOSIS — E785 Hyperlipidemia, unspecified: Secondary | ICD-10-CM

## 2016-09-05 DIAGNOSIS — Z1159 Encounter for screening for other viral diseases: Secondary | ICD-10-CM

## 2016-09-11 ENCOUNTER — Other Ambulatory Visit: Payer: Federal, State, Local not specified - PPO

## 2016-09-16 ENCOUNTER — Encounter: Payer: Federal, State, Local not specified - PPO | Admitting: Family Medicine

## 2016-10-16 ENCOUNTER — Other Ambulatory Visit (INDEPENDENT_AMBULATORY_CARE_PROVIDER_SITE_OTHER): Payer: Medicare Other

## 2016-10-16 DIAGNOSIS — Z1159 Encounter for screening for other viral diseases: Secondary | ICD-10-CM | POA: Diagnosis not present

## 2016-10-16 DIAGNOSIS — E785 Hyperlipidemia, unspecified: Secondary | ICD-10-CM | POA: Diagnosis not present

## 2016-10-16 LAB — LIPID PANEL
Cholesterol: 168 mg/dL (ref 0–200)
HDL: 62.1 mg/dL
LDL Cholesterol: 85 mg/dL (ref 0–99)
NonHDL: 106.14
Total CHOL/HDL Ratio: 3
Triglycerides: 106 mg/dL (ref 0.0–149.0)
VLDL: 21.2 mg/dL (ref 0.0–40.0)

## 2016-10-16 LAB — BASIC METABOLIC PANEL WITH GFR
BUN: 14 mg/dL (ref 6–23)
CO2: 27 meq/L (ref 19–32)
Calcium: 9.1 mg/dL (ref 8.4–10.5)
Chloride: 106 meq/L (ref 96–112)
Creatinine, Ser: 0.79 mg/dL (ref 0.40–1.20)
GFR: 77.46 mL/min
Glucose, Bld: 106 mg/dL — ABNORMAL HIGH (ref 70–99)
Potassium: 4.1 meq/L (ref 3.5–5.1)
Sodium: 140 meq/L (ref 135–145)

## 2016-10-16 LAB — TSH: TSH: 1.6 u[IU]/mL (ref 0.35–4.50)

## 2016-10-17 LAB — HEPATITIS C ANTIBODY: HCV Ab: NEGATIVE

## 2016-10-21 ENCOUNTER — Other Ambulatory Visit: Payer: Self-pay | Admitting: Family Medicine

## 2016-10-21 DIAGNOSIS — Z1231 Encounter for screening mammogram for malignant neoplasm of breast: Secondary | ICD-10-CM

## 2016-10-22 ENCOUNTER — Ambulatory Visit (INDEPENDENT_AMBULATORY_CARE_PROVIDER_SITE_OTHER): Payer: Medicare Other | Admitting: Family Medicine

## 2016-10-22 ENCOUNTER — Encounter: Payer: Self-pay | Admitting: Family Medicine

## 2016-10-22 VITALS — BP 124/76 | HR 78 | Temp 98.6°F | Ht 66.75 in | Wt 142.5 lb

## 2016-10-22 DIAGNOSIS — Z0000 Encounter for general adult medical examination without abnormal findings: Secondary | ICD-10-CM | POA: Diagnosis not present

## 2016-10-22 DIAGNOSIS — Z7189 Other specified counseling: Secondary | ICD-10-CM

## 2016-10-22 DIAGNOSIS — I10 Essential (primary) hypertension: Secondary | ICD-10-CM | POA: Diagnosis not present

## 2016-10-22 DIAGNOSIS — R011 Cardiac murmur, unspecified: Secondary | ICD-10-CM | POA: Diagnosis not present

## 2016-10-22 DIAGNOSIS — M8580 Other specified disorders of bone density and structure, unspecified site: Secondary | ICD-10-CM | POA: Diagnosis not present

## 2016-10-22 DIAGNOSIS — Z23 Encounter for immunization: Secondary | ICD-10-CM | POA: Diagnosis not present

## 2016-10-22 DIAGNOSIS — E785 Hyperlipidemia, unspecified: Secondary | ICD-10-CM | POA: Diagnosis not present

## 2016-10-22 MED ORDER — PRAVASTATIN SODIUM 40 MG PO TABS: 40.0000 mg | ORAL_TABLET | Freq: Every day | ORAL | 3 refills | Status: DC

## 2016-10-22 MED ORDER — AMLODIPINE BESYLATE 2.5 MG PO TABS: 2.5000 mg | ORAL_TABLET | Freq: Every day | ORAL | 3 refills | Status: DC

## 2016-10-22 NOTE — Assessment & Plan Note (Signed)
Update DEXA 2019. Discussed regular weight bearing exercise, reviewed calcium and vitamin D intake.

## 2016-10-22 NOTE — Assessment & Plan Note (Signed)

## 2016-10-22 NOTE — Patient Instructions (Addendum)
EKG today.  prevnar today. You may get new shingles shot shingrix at pharmacy (2 shot series). Wait 1 month between today's shot and shingrix.  You are doing well today. Watch added sugars in diet.  Return as needed or in 1 year for wellness visit.   Health Maintenance, Female Adopting a healthy lifestyle and getting preventive care can go a long way to promote health and wellness. Talk with your health care provider about what schedule of regular examinations is right for you. This is a good chance for you to check in with your provider about disease prevention and staying healthy. In between checkups, there are plenty of things you can do on your own. Experts have done a lot of research about which lifestyle changes and preventive measures are most likely to keep you healthy. Ask your health care provider for more information. Weight and diet Eat a healthy diet  Be sure to include plenty of vegetables, fruits, low-fat dairy products, and lean protein.  Do not eat a lot of foods high in solid fats, added sugars, or salt.  Get regular exercise. This is one of the most important things you can do for your health.  Most adults should exercise for at least 150 minutes each week. The exercise should increase your heart rate and make you sweat (moderate-intensity exercise).  Most adults should also do strengthening exercises at least twice a week. This is in addition to the moderate-intensity exercise. Maintain a healthy weight  Body mass index (BMI) is a measurement that can be used to identify possible weight problems. It estimates body fat based on height and weight. Your health care provider can help determine your BMI and help you achieve or maintain a healthy weight.  For females 73 years of age and older:  A BMI below 18.5 is considered underweight.  A BMI of 18.5 to 24.9 is normal.  A BMI of 25 to 29.9 is considered overweight.  A BMI of 30 and above is considered obese. Watch  levels of cholesterol and blood lipids  You should start having your blood tested for lipids and cholesterol at 66 years of age, then have this test every 5 years.  You may need to have your cholesterol levels checked more often if:  Your lipid or cholesterol levels are high.  You are older than 66 years of age.  You are at high risk for heart disease. Cancer screening Lung Cancer  Lung cancer screening is recommended for adults 55-56 years old who are at high risk for lung cancer because of a history of smoking.  A yearly low-dose CT scan of the lungs is recommended for people who:  Currently smoke.  Have quit within the past 15 years.  Have at least a 30-pack-year history of smoking. A pack year is smoking an average of one pack of cigarettes a day for 1 year.  Yearly screening should continue until it has been 15 years since you quit.  Yearly screening should stop if you develop a health problem that would prevent you from having lung cancer treatment. Breast Cancer  Practice breast self-awareness. This means understanding how your breasts normally appear and feel.  It also means doing regular breast self-exams. Let your health care provider know about any changes, no matter how small.  If you are in your 20s or 30s, you should have a clinical breast exam (CBE) by a health care provider every 1-3 years as part of a regular health exam.  If  you are 40 or older, have a CBE every year. Also consider having a breast X-ray (mammogram) every year.  If you have a family history of breast cancer, talk to your health care provider about genetic screening.  If you are at high risk for breast cancer, talk to your health care provider about having an MRI and a mammogram every year.  Breast cancer gene (BRCA) assessment is recommended for women who have family members with BRCA-related cancers. BRCA-related cancers include:  Breast.  Ovarian.  Tubal.  Peritoneal  cancers.  Results of the assessment will determine the need for genetic counseling and BRCA1 and BRCA2 testing. Cervical Cancer  Your health care provider may recommend that you be screened regularly for cancer of the pelvic organs (ovaries, uterus, and vagina). This screening involves a pelvic examination, including checking for microscopic changes to the surface of your cervix (Pap test). You may be encouraged to have this screening done every 3 years, beginning at age 21.  For women ages 30-65, health care providers may recommend pelvic exams and Pap testing every 3 years, or they may recommend the Pap and pelvic exam, combined with testing for human papilloma virus (HPV), every 5 years. Some types of HPV increase your risk of cervical cancer. Testing for HPV may also be done on women of any age with unclear Pap test results.  Other health care providers may not recommend any screening for nonpregnant women who are considered low risk for pelvic cancer and who do not have symptoms. Ask your health care provider if a screening pelvic exam is right for you.  If you have had past treatment for cervical cancer or a condition that could lead to cancer, you need Pap tests and screening for cancer for at least 20 years after your treatment. If Pap tests have been discontinued, your risk factors (such as having a new sexual partner) need to be reassessed to determine if screening should resume. Some women have medical problems that increase the chance of getting cervical cancer. In these cases, your health care provider may recommend more frequent screening and Pap tests. Colorectal Cancer  This type of cancer can be detected and often prevented.  Routine colorectal cancer screening usually begins at 66 years of age and continues through 66 years of age.  Your health care provider may recommend screening at an earlier age if you have risk factors for colon cancer.  Your health care provider may also  recommend using home test kits to check for hidden blood in the stool.  A small camera at the end of a tube can be used to examine your colon directly (sigmoidoscopy or colonoscopy). This is done to check for the earliest forms of colorectal cancer.  Routine screening usually begins at age 50.  Direct examination of the colon should be repeated every 5-10 years through 66 years of age. However, you may need to be screened more often if early forms of precancerous polyps or small growths are found. Skin Cancer  Check your skin from head to toe regularly.  Tell your health care provider about any new moles or changes in moles, especially if there is a change in a mole's shape or color.  Also tell your health care provider if you have a mole that is larger than the size of a pencil eraser.  Always use sunscreen. Apply sunscreen liberally and repeatedly throughout the day.  Protect yourself by wearing long sleeves, pants, a wide-brimmed hat, and sunglasses whenever you   are outside. Heart disease, diabetes, and high blood pressure  High blood pressure causes heart disease and increases the risk of stroke. High blood pressure is more likely to develop in:  People who have blood pressure in the high end of the normal range (130-139/85-89 mm Hg).  People who are overweight or obese.  People who are African American.  If you are 29-68 years of age, have your blood pressure checked every 3-5 years. If you are 66 years of age or older, have your blood pressure checked every year. You should have your blood pressure measured twice-once when you are at a hospital or clinic, and once when you are not at a hospital or clinic. Record the average of the two measurements. To check your blood pressure when you are not at a hospital or clinic, you can use:  An automated blood pressure machine at a pharmacy.  A home blood pressure monitor.  If you are between 2 years and 81 years old, ask your health  care provider if you should take aspirin to prevent strokes.  Have regular diabetes screenings. This involves taking a blood sample to check your fasting blood sugar level.  If you are at a normal weight and have a low risk for diabetes, have this test once every three years after 66 years of age.  If you are overweight and have a high risk for diabetes, consider being tested at a younger age or more often. Preventing infection Hepatitis B  If you have a higher risk for hepatitis B, you should be screened for this virus. You are considered at high risk for hepatitis B if:  You were born in a country where hepatitis B is common. Ask your health care provider which countries are considered high risk.  Your parents were born in a high-risk country, and you have not been immunized against hepatitis B (hepatitis B vaccine).  You have HIV or AIDS.  You use needles to inject street drugs.  You live with someone who has hepatitis B.  You have had sex with someone who has hepatitis B.  You get hemodialysis treatment.  You take certain medicines for conditions, including cancer, organ transplantation, and autoimmune conditions. Hepatitis C  Blood testing is recommended for:  Everyone born from [birth year] through 1965.  Anyone with known risk factors for hepatitis C. Sexually transmitted infections (STIs)  You should be screened for sexually transmitted infections (STIs) including gonorrhea and chlamydia if:  You are sexually active and are younger than 66 years of age.  You are older than 66 years of age and your health care provider tells you that you are at risk for this type of infection.  Your sexual activity has changed since you were last screened and you are at an increased risk for chlamydia or gonorrhea. Ask your health care provider if you are at risk.  If you do not have HIV, but are at risk, it may be recommended that you take a prescription medicine daily to prevent HIV  infection. This is called pre-exposure prophylaxis (PrEP). You are considered at risk if:  You are sexually active and do not regularly use condoms or know the HIV status of your partner(s).  You take drugs by injection.  You are sexually active with a partner who has HIV. Talk with your health care provider about whether you are at high risk of being infected with HIV. If you choose to begin PrEP, you should first be tested for HIV. You  should then be tested every 3 months for as long as you are taking PrEP. Pregnancy  If you are premenopausal and you may become pregnant, ask your health care provider about preconception counseling.  If you may become pregnant, take 400 to 800 micrograms (mcg) of folic acid every day.  If you want to prevent pregnancy, talk to your health care provider about birth control (contraception). Osteoporosis and menopause  Osteoporosis is a disease in which the bones lose minerals and strength with aging. This can result in serious bone fractures. Your risk for osteoporosis can be identified using a bone density scan.  If you are 65 years of age or older, or if you are at risk for osteoporosis and fractures, ask your health care provider if you should be screened.  Ask your health care provider whether you should take a calcium or vitamin D supplement to lower your risk for osteoporosis.  Menopause may have certain physical symptoms and risks.  Hormone replacement therapy may reduce some of these symptoms and risks. Talk to your health care provider about whether hormone replacement therapy is right for you. Follow these instructions at home:  Schedule regular health, dental, and eye exams.  Stay current with your immunizations.  Do not use any tobacco products including cigarettes, chewing tobacco, or electronic cigarettes.  If you are pregnant, do not drink alcohol.  If you are breastfeeding, limit how much and how often you drink alcohol.  Limit  alcohol intake to no more than 1 drink per day for nonpregnant women. One drink equals 12 ounces of beer, 5 ounces of wine, or 1 ounces of hard liquor.  Do not use street drugs.  Do not share needles.  Ask your health care provider for help if you need support or information about quitting drugs.  Tell your health care provider if you often feel depressed.  Tell your health care provider if you have ever been abused or do not feel safe at home. This information is not intended to replace advice given to you by your health care provider. Make sure you discuss any questions you have with your health care provider. Document Released: 12/30/2010 Document Revised: 11/22/2015 Document Reviewed: 03/20/2015 Elsevier Interactive Patient Education  2017 [employer]  

## 2016-10-22 NOTE — Assessment & Plan Note (Signed)
Chronic, stable. Continue pravastatin. 

## 2016-10-22 NOTE — Progress Notes (Addendum)
BP 124/76   Pulse 78   Temp 98.6 F (37 C) (Oral)   Ht 5' 6.75" (1.695 m)   Wt 142 lb 8 oz (64.6 kg)   SpO2 100%   BMI 22.49 kg/m    CC: welcome to medicare visit Subjective:    Patient ID: Sheri Patel, female    DOB: 1951-01-02, 66 y.o.   MRN: [id number]  HPI: Sheri Patel is a 66 y.o. female presenting on 10/22/2016 for Annual Exam (welcome to medicare)   Screenings in chart reviewed.  Weaned off lexapro last month. Doing well.   Has been donating platelets regularly at blood center. Told had high platelet counts.   Preventative: COLONOSCOPY Date: 12/2011 normal ([facility] in [place]) Well woman exam at our office 08/2015 WNL, rec rpt pap 2020.  Mammogram 08/2015 WNL. Self breast exams at home without concerns. mammo scheduled for tomorrow.  DEXA Date: 09/2012 T -1.9 at spine, -0.7 at hip, was on fosamax for 5 yrs, stopped 10/2013. Will repeat 2019.  Flu shot - yearly  Td 2010  Prevnar today zostavax - 2012, shingrex reviewed - pt to receive at pharmacy.  Advanced planning: In chart - HCPOA is husband Richard then [place]. Does not want prolonged life support (10/2014) Seat belt use discussed Sunscreen use discussed. No changing moles on skin. 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 S/p 5FU treatment to nose.  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 Interim medical history since our last visit reviewed. Allergies and medications reviewed and updated. Outpatient Medications Prior to Visit  Medication Sig Dispense Refill  . amLODipine (NORVASC) 2.5 MG tablet Take 1 tablet (2.5 mg total) by mouth daily. 90 tablet 3  . aspirin 81 MG tablet Take 81 mg by mouth every Monday,  Wednesday, and Friday.     . cholecalciferol (VITAMIN D) 1000 units tablet Take 1,000 Units by mouth daily.    . cycloSPORINE (RESTASIS) 0.05 % ophthalmic emulsion Place 1 drop into both eyes 2 (two) times daily.    . pravastatin (PRAVACHOL) 40 MG tablet take 1 tablet by mouth once daily 90 tablet 3  . escitalopram (LEXAPRO) 10 MG tablet take 1 tablet by mouth once daily 30 tablet 3   No facility-administered medications prior to visit.      Per HPI unless specifically indicated in ROS section below Review of Systems     Objective:    BP 124/76   Pulse 78   Temp 98.6 F (37 C) (Oral)   Ht 5' 6.75" (1.695 m)   Wt 142 lb 8 oz (64.6 kg)   SpO2 100%   BMI 22.49 kg/m   Wt Readings from Last 3 Encounters:  10/22/16 142 lb 8 oz (64.6 kg)  09/13/15 132 lb 12 oz (60.2 kg)  08/14/14 134 lb 8 oz (61 kg)    Physical Exam  Constitutional: She is oriented to person, place, and time. She appears well-developed and well-nourished. No distress.  HENT:  Head: Normocephalic and atraumatic.  Right Ear: Hearing, tympanic membrane, external ear and ear canal normal.  Left Ear: Hearing, tympanic membrane, external ear and ear canal normal.  Nose: Nose normal.  Mouth/Throat: Uvula is midline, oropharynx is clear and moist and mucous membranes are normal. No oropharyngeal  exudate, posterior oropharyngeal edema or posterior oropharyngeal erythema.  Eyes: Conjunctivae and EOM are normal. Pupils are equal, round, and reactive to light. No scleral icterus.  Neck: Normal range of motion. Neck supple. Carotid bruit is present (L>R - anticipate radiation from SEM). No thyromegaly present.  Cardiovascular: Normal rate, regular rhythm and intact distal pulses.   Murmur (3/6 systolic best at RUSB) heard. Pulses:      Radial pulses are 2+ on the right side, and 2+ on the left side.  Pulmonary/Chest: Effort normal and breath sounds normal. No respiratory distress. She has no wheezes. She has no rales.    Abdominal: Soft. Bowel sounds are normal. She exhibits no distension and no mass. There is no tenderness. There is no rebound and no guarding.  Musculoskeletal: Normal range of motion. She exhibits no edema.  Lymphadenopathy:    She has no cervical adenopathy.  Neurological: She is alert and oriented to person, place, and time.  CN grossly intact, station and gait intact  Skin: Skin is warm and dry. No rash noted.  Psychiatric: She has a normal mood and affect. Her behavior is normal. Judgment and thought content normal.  Nursing note and vitals reviewed.  Results for orders placed or performed in visit on 10/16/16  Lipid panel  Result Value Ref Range   Cholesterol 168 0 - 200 mg/dL   Triglycerides 106.0 0.0 - 149.0 mg/dL   HDL 62.10 >39.00 mg/dL   VLDL 21.2 0.0 - 40.0 mg/dL   LDL Cholesterol 85 0 - 99 mg/dL   Total CHOL/HDL Ratio 3    NonHDL 106.14   TSH  Result Value Ref Range   TSH 1.60 0.35 - 4.50 uIU/mL  Basic metabolic panel  Result Value Ref Range   Sodium 140 135 - 145 mEq/L   Potassium 4.1 3.5 - 5.1 mEq/L   Chloride 106 96 - 112 mEq/L   CO2 27 19 - 32 mEq/L   Glucose, Bld 106 (H) 70 - 99 mg/dL   BUN 14 6 - 23 mg/dL   Creatinine, Ser 0.79 0.40 - 1.20 mg/dL   Calcium 9.1 8.4 - 10.5 mg/dL   GFR 77.46 >60.00 mL/min  Hepatitis C antibody  Result Value Ref Range   HCV Ab NEGATIVE NEGATIVE   EKG NSR rate 70, normal axis, intervals, no acute ST/T changes.     Assessment & Plan:   Problem List Items Addressed This Visit    Advanced care planning/counseling discussion    Advanced planning: In chart - HCPOA is husband Richard then [place]. Does not want prolonged life support (10/2014)      Hyperlipidemia    Chronic, stable. Continue current regimen.       Hypertension    Chronic, stable. Continue pravastatin.       Osteopenia    Update DEXA 2019. Discussed regular weight bearing exercise, reviewed calcium and vitamin D intake.       Systolic murmur     Anticipate aortic sclerosis. asxs. Check EKG. Consider echo if louder.       Welcome to Medicare preventive visit - Primary    I have personally reviewed the Medicare Annual Wellness questionnaire and have noted 1. The patient's medical and social history 2. Their use of alcohol, tobacco or illicit drugs 3. Their current medications and supplements 4. The patient's functional ability including ADL's, fall risks, home safety risks and hearing or visual impairment. Cognitive function has been assessed and addressed as indicated.  5. Diet and physical activity  6. Evidence for depression or mood disorders The patients weight, height, BMI have been recorded in the chart. I have made referrals, counseling and provided education to the patient based on review of the above and I have provided the pt with a written personalized care plan for preventive services. Provider list updated.. See scanned questionairre as needed for further documentation. Reviewed preventative protocols and updated unless pt declined.           Follow up plan: Return in about 1 year (around 10/22/2017) for medicare wellness visit, annual exam, prior fasting for blood work.  Ria Bush, MD

## 2016-10-22 NOTE — Assessment & Plan Note (Signed)
Chronic, stable. Continue current regimen. 

## 2016-10-22 NOTE — Assessment & Plan Note (Signed)
Advanced planning: In chart - HCPOA is husband Sheri Patel then Sheri Patel. Does not want prolonged life support (10/2014) 

## 2016-10-22 NOTE — Progress Notes (Signed)
Pre visit review using our clinic review tool, if applicable. No additional management support is needed unless otherwise documented below in the visit note. 

## 2016-10-22 NOTE — Assessment & Plan Note (Signed)
Anticipate aortic sclerosis. asxs. Check EKG. Consider echo if louder.

## 2016-10-23 ENCOUNTER — Ambulatory Visit
Admission: RE | Admit: 2016-10-23 | Discharge: 2016-10-23 | Disposition: A | Discharge status: Home / Self Care | Payer: Medicare Other | Source: Ambulatory Visit | Attending: Family Medicine | Admitting: Family Medicine

## 2016-10-23 DIAGNOSIS — Z1231 Encounter for screening mammogram for malignant neoplasm of breast: Secondary | ICD-10-CM | POA: Diagnosis not present

## 2016-10-23 LAB — HM MAMMOGRAPHY

## 2016-10-24 ENCOUNTER — Encounter: Payer: Self-pay | Admitting: *Deleted

## 2016-10-27 ENCOUNTER — Other Ambulatory Visit: Payer: Self-pay | Admitting: Family Medicine

## 2017-02-03 ENCOUNTER — Encounter: Payer: Self-pay | Admitting: Family Medicine

## 2017-04-07 DIAGNOSIS — Z9889 Other specified postprocedural states: Secondary | ICD-10-CM | POA: Diagnosis not present

## 2017-04-07 DIAGNOSIS — H2513 Age-related nuclear cataract, bilateral: Secondary | ICD-10-CM | POA: Diagnosis not present

## 2017-04-07 DIAGNOSIS — H43813 Vitreous degeneration, bilateral: Secondary | ICD-10-CM | POA: Diagnosis not present

## 2017-04-07 DIAGNOSIS — H1859 Other hereditary corneal dystrophies: Secondary | ICD-10-CM | POA: Diagnosis not present

## 2017-04-07 DIAGNOSIS — H04123 Dry eye syndrome of bilateral lacrimal glands: Secondary | ICD-10-CM | POA: Diagnosis not present

## 2017-04-16 DIAGNOSIS — Z23 Encounter for immunization: Secondary | ICD-10-CM | POA: Diagnosis not present

## 2017-04-17 ENCOUNTER — Encounter: Payer: Self-pay | Admitting: Family Medicine

## 2017-07-20 ENCOUNTER — Encounter: Payer: Self-pay | Admitting: Family Medicine

## 2017-07-20 MED ORDER — ESCITALOPRAM OXALATE 10 MG PO TABS: 10.0000 mg | ORAL_TABLET | Freq: Every day | ORAL | 1 refills | Status: DC

## 2017-09-29 ENCOUNTER — Encounter: Payer: Self-pay | Admitting: Family Medicine

## 2017-09-29 ENCOUNTER — Other Ambulatory Visit: Payer: Self-pay

## 2017-09-29 MED ORDER — PRAVASTATIN SODIUM 40 MG PO TABS: 40.0000 mg | ORAL_TABLET | Freq: Every day | ORAL | 0 refills | Status: DC

## 2017-09-29 NOTE — Telephone Encounter (Signed)
Sent refill

## 2017-10-27 ENCOUNTER — Ambulatory Visit: Payer: Federal, State, Local not specified - PPO

## 2017-10-29 ENCOUNTER — Encounter: Payer: Federal, State, Local not specified - PPO | Admitting: Family Medicine

## 2017-11-11 ENCOUNTER — Telehealth: Payer: Self-pay

## 2017-11-11 MED ORDER — AMLODIPINE BESYLATE 2.5 MG PO TABS: 2.5000 mg | ORAL_TABLET | Freq: Every day | ORAL | 0 refills | Status: DC

## 2017-11-24 ENCOUNTER — Other Ambulatory Visit: Payer: Self-pay | Admitting: Family Medicine

## 2017-11-24 DIAGNOSIS — Z1231 Encounter for screening mammogram for malignant neoplasm of breast: Secondary | ICD-10-CM

## 2017-12-11 ENCOUNTER — Other Ambulatory Visit: Payer: Self-pay | Admitting: Family Medicine

## 2017-12-14 ENCOUNTER — Ambulatory Visit
Admission: RE | Admit: 2017-12-14 | Discharge: 2017-12-14 | Disposition: A | Discharge status: Home / Self Care | Payer: Medicare Other | Source: Ambulatory Visit | Attending: Family Medicine | Admitting: Family Medicine

## 2017-12-14 DIAGNOSIS — Z1231 Encounter for screening mammogram for malignant neoplasm of breast: Secondary | ICD-10-CM | POA: Diagnosis not present

## 2017-12-14 LAB — HM MAMMOGRAPHY

## 2017-12-16 ENCOUNTER — Encounter: Payer: Self-pay | Admitting: Family Medicine

## 2018-01-19 ENCOUNTER — Other Ambulatory Visit: Payer: Self-pay | Admitting: Family Medicine

## 2018-01-24 ENCOUNTER — Other Ambulatory Visit: Payer: Self-pay | Admitting: Family Medicine

## 2018-01-27 ENCOUNTER — Other Ambulatory Visit: Payer: Self-pay

## 2018-02-04 ENCOUNTER — Other Ambulatory Visit: Payer: Self-pay | Admitting: Family Medicine

## 2018-02-04 ENCOUNTER — Ambulatory Visit (INDEPENDENT_AMBULATORY_CARE_PROVIDER_SITE_OTHER): Payer: Medicare Other

## 2018-02-04 VITALS — BP 122/92 | HR 77 | Temp 98.8°F | Ht 66.5 in | Wt 147.5 lb

## 2018-02-04 DIAGNOSIS — I10 Essential (primary) hypertension: Secondary | ICD-10-CM | POA: Diagnosis not present

## 2018-02-04 DIAGNOSIS — E785 Hyperlipidemia, unspecified: Secondary | ICD-10-CM

## 2018-02-04 DIAGNOSIS — Z0000 Encounter for general adult medical examination without abnormal findings: Secondary | ICD-10-CM

## 2018-02-04 DIAGNOSIS — Z23 Encounter for immunization: Secondary | ICD-10-CM | POA: Diagnosis not present

## 2018-02-04 LAB — LIPID PANEL
CHOL/HDL RATIO: 3
Cholesterol: 175 mg/dL (ref 0–200)
HDL: 53.5 mg/dL (ref >39.00)
LDL CALC: 104 mg/dL — AB (ref 0–99)
NonHDL: 121.83
TRIGLYCERIDES: 87 mg/dL (ref 0.0–149.0)
VLDL: 17.4 mg/dL (ref 0.0–40.0)

## 2018-02-04 LAB — MICROALBUMIN / CREATININE URINE RATIO
CREATININE, U: 124.7 mg/dL
MICROALB UR: 1 mg/dL (ref 0.0–1.9)
Microalb Creat Ratio: 0.8 mg/g (ref 0.0–30.0)

## 2018-02-04 LAB — COMPREHENSIVE METABOLIC PANEL
ALT: 14 U/L (ref 0–35)
AST: 13 U/L (ref 0–37)
Albumin: 4.4 g/dL (ref 3.5–5.2)
Alkaline Phosphatase: 52 U/L (ref 39–117)
BUN: 17 mg/dL (ref 6–23)
CHLORIDE: 103 meq/L (ref 96–112)
CO2: 31 meq/L (ref 19–32)
Calcium: 10 mg/dL (ref 8.4–10.5)
Creatinine, Ser: 0.86 mg/dL (ref 0.40–1.20)
GFR: 69.95 mL/min (ref >60.00)
Glucose, Bld: 104 mg/dL — ABNORMAL HIGH (ref 70–99)
Potassium: 4.6 mEq/L (ref 3.5–5.1)
Sodium: 140 mEq/L (ref 135–145)
Total Bilirubin: 0.2 mg/dL (ref 0.2–1.2)
Total Protein: 7.2 g/dL (ref 6.0–8.3)

## 2018-02-04 NOTE — Progress Notes (Signed)
Subjective:   Sheri Patel is a 67 y.o. female who presents for Medicare Annual (Subsequent) preventive examination.  Review of Systems:  N/A Cardiac Risk Factors include: advanced age (>48men, >56 women);hypertension;dyslipidemia     Objective:     Vitals: BP (!) 122/92 (BP Location: Right Arm, Patient Position: Sitting, Cuff Size: Normal)   Pulse 77   Temp 98.8 F (37.1 C) (Oral)   Ht 5' 6.5" (1.689 m) Comment: no shoes  Wt 147 lb 8 oz (66.9 kg)   SpO2 95%   BMI 23.45 kg/m   Body mass index is 23.45 kg/m.  Advanced Directives 02/04/2018  Does Patient Have a Medical Advance Directive? Yes  Type of Paramedic of [place];Living will  Copy of [place] in Chart? Yes    Tobacco Social History   Tobacco Use  Smoking Status Former Smoker  Smokeless Tobacco Never Used     Counseling given: No   Clinical Intake:  Pre-visit preparation completed: Yes  Pain : No/denies pain Pain Score: 0-No pain     Nutritional Status: BMI of 19-24  Normal Nutritional Risks: None Diabetes: No  How often do you need to have someone help you when you read instructions, pamphlets, or other written materials from your doctor or pharmacy?: 1 - Never What is the last grade level you completed in school?: Masters degree  Interpreter Needed?: No  Comments: pt lives with spouse Information entered by :: LPinson, LPN  Past Medical History:  Diagnosis Date  . Arthritis    hands, off meds  . Atrophic vaginitis 10/23/14   by prior OBGYN  . History of migraine    resolved after menopause  . Hyperlipidemia   . Hypertension   . Osteopenia 09/2012   DEXA T-1.9 at spine, consider rpt in 3-5 yrs  . Situational depression 10-22-2009   after brother died (lung cancer)  . Ventricular ectopy    benign, s/p cards eval   Past Surgical History:  Procedure Laterality Date  . COLONOSCOPY  12/2011   normal ([facility] in [place])  . DEXA  09/2012   T -1.9 at spine, -0.7 at hip  . TONSILLECTOMY  1966   Family History  Problem Relation Age of Onset  . Alcohol abuse Mother   . Arthritis Mother   . Hyperlipidemia Mother   . CAD Mother 73       MI  . Hypertension Mother   . Hyperlipidemia Father   . CAD Father 64       MI  . Alcohol abuse Brother   . Cancer Brother        lung (smoker)  . CAD Brother 56       MI  . Hyperlipidemia Brother   . Hypertension Brother   . Mental illness Brother   . Diabetes Brother   . Hypertension Sister   . Arthritis Maternal Grandmother   . Stroke Neg Hx    Social History   Socioeconomic History  . Marital status: Married    Spouse name: Not on file  . Number of children: Not on file  . Years of education: Not on file  . Highest education level: Not on file  Occupational History  . Not on file  Social Needs  . Financial resource strain: Not on file  . Food insecurity:    Worry: Not on file    Inability: Not on file  . Transportation needs:    Medical: Not on file  Non-medical: Not on file  Tobacco Use  . Smoking status: Former Research scientist (life sciences)  . Smokeless tobacco: Never Used  Substance and Sexual Activity  . Alcohol use: Not Currently    Alcohol/week: 0.0 standard drinks  . Drug use: No  . Sexual activity: Not on file  Lifestyle  . Physical activity:    Days per week: Not on file    Minutes per session: Not on file  . Stress: Not on file  Relationships  . Social connections:    Talks on phone: Not on file    Gets together: Not on file    Attends religious service: Not on file    Active member of club or organization: Not on file    Attends meetings of clubs or organizations: Not on file    Relationship status: Not on file  Other Topics Concern  . 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 amLODipine (NORVASC) 2.5 MG tablet TAKE 1 TABLET BY MOUTH DAILY  . aspirin 81 MG tablet Take 81 mg by mouth every Monday, Wednesday, and Friday.   . cholecalciferol (VITAMIN D) 1000 units tablet Take 1,000 Units by mouth daily.  . cycloSPORINE (RESTASIS) 0.05 % ophthalmic emulsion Place 1 drop into both eyes 2 (two) times daily.  Marland Kitchen escitalopram (LEXAPRO) 10 MG tablet TAKE 1 TABLET BY MOUTH ONCE DAILY  . pravastatin (PRAVACHOL) 40 MG tablet TAKE 1 TABLET(40 MG) BY MOUTH DAILY   No facility-administered encounter medications on file as of 02/04/2018.     Activities of Daily Living In your present state of health, do you have any difficulty performing the following activities: 02/04/2018  Hearing? N  Vision? N  Difficulty concentrating or making decisions? Y  Walking or climbing stairs? N  Dressing or bathing? N  Doing errands, shopping? N  Preparing Food and eating ? N  Using the Toilet? N  In the past six months, have you accidently leaked urine? N  Do you have problems with loss of bowel control? N  Managing your Medications? N  Managing your Finances? N  Housekeeping or managing your Housekeeping? N  Some recent data might be hidden    Patient Care Team: Ria Bush, MD as PCP - General (Family Medicine)    Assessment:   This is a routine wellness examination for [place].    Hearing Screening   125Hz  250Hz  500Hz  1000Hz  2000Hz  3000Hz  4000Hz  6000Hz  8000Hz   Right ear:   40 40 40  40    Left ear:   40 40 40  40    Vision Screening Comments: Vision exam in April 2019 with Dr. Warden Fillers  Exercise Activities and Dietary recommendations Current Exercise Habits: Home exercise routine;Structured exercise class, Type of exercise: yoga;walking;Other - see comments(Silver Sneakers), Time (Minutes): 30(class is 60 min 3 days per week), Frequency (Times/Week):  7, Weekly Exercise (Minutes/Week): 210, Intensity: Moderate, Exercise limited by: None identified  Goals    . Patient Stated     Starting 02/04/2018, I will continue to walk for 30 minutes daily and to participate in Silver Sneakers for 60 minutes 3 days per week.  Fall Risk Fall Risk  02/04/2018 10/22/2016  Falls in the past year? No No   Depression Screen PHQ 2/9 Scores 02/04/2018 10/22/2016  PHQ - 2 Score 0 0  PHQ- 9 Score 0 -     Cognitive Function MMSE - Mini Mental State Exam 02/04/2018  Orientation to time 5  Orientation to Place 5  Registration 3  Attention/ Calculation 0  Recall 3  Language- name 2 objects 0  Language- repeat 1  Language- follow 3 step command 3  Language- read & follow direction 0  Write a sentence 0  Copy design 0  Total score 20        Immunization History  Administered Date(s) Administered  . Influenza, High Dose Seasonal PF 04/16/2017  . Influenza,inj,Quad PF,6+ Mos 05/04/2014  . Influenza-Unspecified 06/07/2015  . Pneumococcal Conjugate-13 10/22/2016  . Pneumococcal Polysaccharide-23 02/04/2018  . Td 06/30/2008  . Zoster 06/30/2010    Screening Tests Health Maintenance  Topic Date Due  . DTaP/Tdap/Td (1 - Tdap) 06/30/2018 (Originally 07/01/2008)  . INFLUENZA VACCINE  09/28/2018 (Originally 01/28/2018)  . TETANUS/TDAP  06/30/2018  . MAMMOGRAM  12/15/2018  . COLONOSCOPY  01/08/2022  . DEXA SCAN  Completed  . Hepatitis C Screening  Completed  . PNA vac Low Risk Adult  Completed       Plan:     I have personally reviewed, addressed, and noted the following in the patient's chart:  A. Medical and social history B. Use of alcohol, tobacco or illicit drugs  C. Current medications and supplements D. Functional ability and status E.  Nutritional status F.  Physical activity G. Advance directives H. List of other physicians I.  Hospitalizations, surgeries, and ER visits in previous 12 months J.  [place] to include  hearing, vision, cognitive, depression L. Referrals and appointments - none  In addition, I have reviewed and discussed with patient certain preventive protocols, quality metrics, and best practice recommendations. A written personalized care plan for preventive services as well as general preventive health recommendations were provided to patient.  See attached scanned questionnaire for additional information.   Signed,   Lindell Noe, MHA, BS, LPN Health Coach

## 2018-02-04 NOTE — Progress Notes (Signed)
PCP notes:   Health maintenance:  Flu vaccine - addressed PNA vaccine - administered  Abnormal screenings:   None  Patient concerns:   None  Nurse concerns:  None  Next PCP appt:   02/09/18 @ 1500

## 2018-02-04 NOTE — Patient Instructions (Signed)
Sheri Patel , Thank you for taking time to come for your Medicare Wellness Visit. I appreciate your ongoing commitment to your health goals. Please review the following plan we discussed and let me know if I can assist you in the future.   These are the goals we discussed: Goals    . Patient Stated     Starting 02/04/2018, I will continue to walk for 30 minutes daily and to participate in Silver Sneakers for 60 minutes 3 days per week.        This is a list of the screening recommended for you and due dates:  Health Maintenance  Topic Date Due  . DTaP/Tdap/Td vaccine (1 - Tdap) 06/30/2018*  . Flu Shot  09/28/2018*  . Tetanus Vaccine  06/30/2018  . Mammogram  12/15/2018  . Colon Cancer Screening  01/08/2022  . DEXA scan (bone density measurement)  Completed  .  Hepatitis C: One time screening is recommended by Center for Disease Control  (CDC) for  adults born from [birth year] through 1965.   Completed  . Pneumonia vaccines  Completed  *Topic was postponed. The date shown is not the original due date.   Preventive Care for Adults  A healthy lifestyle and preventive care can promote health and wellness. Preventive health guidelines for adults include the following key practices.  . A routine yearly physical is a good way to check with your health care provider about your health and preventive screening. It is a chance to share any concerns and updates on your health and to receive a thorough exam.  . Visit your dentist for a routine exam and preventive care every 6 months. Brush your teeth twice a day and floss once a day. Good oral hygiene prevents tooth decay and gum disease.  . The frequency of eye exams is based on your age, health, family medical history, use  of contact lenses, and other factors. Follow your health care provider's recommendations for frequency of eye exams.  . Eat a healthy diet. Foods like vegetables, fruits, whole grains, low-fat dairy products, and lean protein  foods contain the nutrients you need without too many calories. Decrease your intake of foods high in solid fats, added sugars, and salt. Eat the right amount of calories for you. Get information about a proper diet from your health care provider, if necessary.  . Regular physical exercise is one of the most important things you can do for your health. Most adults should get at least 150 minutes of moderate-intensity exercise (any activity that increases your heart rate and causes you to sweat) each week. In addition, most adults need muscle-strengthening exercises on 2 or more days a week.  Silver Sneakers may be a benefit available to you. To determine eligibility, you may visit the website: www.silversneakers.com or contact program at 270-588-9480 Mon-Fri between 8AM-8PM.   . Maintain a healthy weight. The body mass index (BMI) is a screening tool to identify possible weight problems. It provides an estimate of body fat based on height and weight. Your health care provider can find your BMI and can help you achieve or maintain a healthy weight.   For adults 20 years and older: ? A BMI below 18.5 is considered underweight. ? A BMI of 18.5 to 24.9 is normal. ? A BMI of 25 to 29.9 is considered overweight. ? A BMI of 30 and above is considered obese.   . Maintain normal blood lipids and cholesterol levels by exercising and minimizing your intake  of saturated fat. Eat a balanced diet with plenty of fruit and vegetables. Blood tests for lipids and cholesterol should begin at age 62 and be repeated every 5 years. If your lipid or cholesterol levels are high, you are over 50, or you are at high risk for heart disease, you may need your cholesterol levels checked more frequently. Ongoing high lipid and cholesterol levels should be treated with medicines if diet and exercise are not working.  . If you smoke, find out from your health care provider how to quit. If you do not use tobacco, please do not  start.  . If you choose to drink alcohol, please do not consume more than 2 drinks per day. One drink is considered to be 12 ounces (355 mL) of beer, 5 ounces (148 mL) of wine, or 1.5 ounces (44 mL) of liquor.  . If you are 52-35 years old, ask your health care provider if you should take aspirin to prevent strokes.  . Use sunscreen. Apply sunscreen liberally and repeatedly throughout the day. You should seek shade when your shadow is shorter than you. Protect yourself by wearing long sleeves, pants, a wide-brimmed hat, and sunglasses year round, whenever you are outdoors.  . Once a month, do a whole body skin exam, using a mirror to look at the skin on your back. Tell your health care provider of new moles, moles that have irregular borders, moles that are larger than a pencil eraser, or moles that have changed in shape or color.

## 2018-02-09 ENCOUNTER — Ambulatory Visit (INDEPENDENT_AMBULATORY_CARE_PROVIDER_SITE_OTHER): Payer: Medicare Other | Admitting: Family Medicine

## 2018-02-09 ENCOUNTER — Encounter: Payer: Self-pay | Admitting: Family Medicine

## 2018-02-09 VITALS — BP 124/86 | HR 72 | Temp 98.4°F | Ht 65.5 in | Wt 145.5 lb

## 2018-02-09 DIAGNOSIS — M8580 Other specified disorders of bone density and structure, unspecified site: Secondary | ICD-10-CM | POA: Diagnosis not present

## 2018-02-09 DIAGNOSIS — M899 Disorder of bone, unspecified: Secondary | ICD-10-CM

## 2018-02-09 DIAGNOSIS — E785 Hyperlipidemia, unspecified: Secondary | ICD-10-CM

## 2018-02-09 DIAGNOSIS — R011 Cardiac murmur, unspecified: Secondary | ICD-10-CM | POA: Diagnosis not present

## 2018-02-09 DIAGNOSIS — Z7189 Other specified counseling: Secondary | ICD-10-CM | POA: Diagnosis not present

## 2018-02-09 DIAGNOSIS — I10 Essential (primary) hypertension: Secondary | ICD-10-CM

## 2018-02-09 MED ORDER — PRAVASTATIN SODIUM 40 MG PO TABS: ORAL_TABLET | ORAL | 3 refills | Status: DC

## 2018-02-09 MED ORDER — ESCITALOPRAM OXALATE 10 MG PO TABS: 10.0000 mg | ORAL_TABLET | Freq: Every day | ORAL | 3 refills | Status: DC

## 2018-02-09 MED ORDER — AMLODIPINE BESYLATE 2.5 MG PO TABS: 2.5000 mg | ORAL_TABLET | Freq: Every day | ORAL | 3 refills | Status: DC

## 2018-02-09 NOTE — Assessment & Plan Note (Signed)
Chronic stable on pravastatin. The 10-year ASCVD risk score Mikey Bussing DC Brooke Bonito., et al., 2013) is: 8.2%   Values used to calculate the score:     Age: 67 years     Sex: Female     Is Non-Hispanic African American: No     Diabetic: No     Tobacco smoker: No     Systolic Blood Pressure: 623 mmHg     Is BP treated: Yes     HDL Cholesterol: 53.5 mg/dL     Total Cholesterol: 175 mg/dL

## 2018-02-09 NOTE — Progress Notes (Signed)
BP 124/86 (BP Location: Left Arm, Patient Position: Sitting, Cuff Size: Normal)   Pulse 72   Temp 98.4 F (36.9 C) (Oral)   Ht 5' 5.5" (1.664 m)   Wt 145 lb 8 oz (66 kg)   SpO2 97%   BMI 23.84 kg/m    CC: CPE Subjective:    Patient ID: Sheri Patel, female    DOB: 05/26/1951, 67 y.o.   MRN: [id number]  HPI: Sheri Patel is a 67 y.o. female presenting on 02/09/2018 for Annual Exam (Pt 2. )   Saw Katha Cabal last week for medicare wellness visit. Note reviewed.   Recent trip to [place].   Preventative: COLONOSCOPY Date: 12/2011 normal ([facility] in [place]) Well woman exam at our office 08/2015 WNL, rec rpt pap 2020.  Mammogram 11/2017 WNL. Self breast exams at home without concerns.  DEXA Date: 09/2012 T -1.9 at spine, -0.7 at hip, was on fosamax for 5 yrs, stopped 10/2013. Will repeat this year 2019. notes shrinking.  Flu shot - yearly  Td 2010  Prevnar 09/2016, pneumovax 01/2018 zostavax - 2012 shingrix reviewed - pt to receive at pharmacy.  Advanced planning: In chart - HCPOA is husband Richard then [place]. Does not want prolonged life support (10/2014) Seat belt use discussed Sunscreen use discussed. No changing moles on skin. Sees derm Special educational needs teacher) Q61yrs. S/p 5FU treatment to nose.  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 Interim medical history since our last visit reviewed. Allergies and medications reviewed and updated. Outpatient Medications Prior to Visit  Medication Sig Dispense Refill  . aspirin 81 MG tablet Take  81 mg by mouth every Monday, Wednesday, and Friday.     . cholecalciferol (VITAMIN D) 1000 units tablet Take 1,000 Units by mouth daily.    . cycloSPORINE (RESTASIS) 0.05 % ophthalmic emulsion Place 1 drop into both eyes 2 (two) times daily.    Marland Kitchen amLODipine (NORVASC) 2.5 MG tablet TAKE 1 TABLET BY MOUTH DAILY 90 tablet 0  . escitalopram (LEXAPRO) 10 MG tablet TAKE 1 TABLET BY MOUTH ONCE DAILY 90 tablet 0  . pravastatin (PRAVACHOL) 40 MG tablet TAKE 1 TABLET(40 MG) BY MOUTH DAILY 90 tablet 0   No facility-administered medications prior to visit.      Per HPI unless specifically indicated in ROS section below Review of Systems  Constitutional: Negative for chills and fever.  Respiratory: Negative for shortness of breath.   Cardiovascular: Negative for chest pain.  Gastrointestinal: Negative for abdominal pain.  Psychiatric/Behavioral: Negative for dysphoric mood. The patient is not nervous/anxious.        Objective:    BP 124/86 (BP Location: Left Arm, Patient Position: Sitting, Cuff Size: Normal)   Pulse 72   Temp 98.4 F (36.9 C) (Oral)   Ht 5' 5.5" (1.664 m)   Wt 145 lb 8 oz (66 kg)   SpO2 97%   BMI 23.84 kg/m   Wt Readings from Last 3 Encounters:  02/09/18 145 lb 8 oz (66 kg)  02/04/18 147 lb 8 oz (66.9 kg)  10/22/16 142 lb 8 oz (  64.6 kg)    Physical Exam  Constitutional: She is oriented to person, place, and time. She appears well-developed and well-nourished. No distress.  HENT:  Head: Normocephalic and atraumatic.  Right Ear: Hearing, tympanic membrane, external ear and ear canal normal.  Left Ear: Hearing, tympanic membrane, external ear and ear canal normal.  Nose: Nose normal.  Mouth/Throat: Uvula is midline, oropharynx is clear and moist and mucous membranes are normal. No oropharyngeal exudate, posterior oropharyngeal edema or posterior oropharyngeal erythema.  Eyes: Pupils are equal, round, and reactive to light. Conjunctivae and EOM are normal. No scleral  icterus.  Neck: Normal range of motion. Neck supple. Carotid bruit is present (radiation from murmur). No thyromegaly present.  Cardiovascular: Normal rate, regular rhythm and intact distal pulses.  Murmur (3/6 systolic) heard. Pulses:      Radial pulses are 2+ on the right side, and 2+ on the left side.  Pulmonary/Chest: Effort normal and breath sounds normal. No respiratory distress. She has no wheezes. She has no rales.  Abdominal: Soft. Bowel sounds are normal. She exhibits no distension and no mass. There is no tenderness. There is no rebound and no guarding.  Musculoskeletal: Normal range of motion. She exhibits no edema.  Lymphadenopathy:    She has no cervical adenopathy.  Neurological: She is alert and oriented to person, place, and time.  CN grossly intact, station and gait intact  Skin: Skin is warm and dry. No rash noted.  Psychiatric: She has a normal mood and affect. Her behavior is normal. Judgment and thought content normal.  Nursing note and vitals reviewed.  Results for orders placed or performed in visit on 02/04/18  Microalbumin / creatinine urine ratio  Result Value Ref Range   Microalb, Ur 1.0 0.0 - 1.9 mg/dL   Creatinine,U 124.7 mg/dL   Microalb Creat Ratio 0.8 0.0 - 30.0 mg/g  Comprehensive metabolic panel  Result Value Ref Range   Sodium 140 135 - 145 mEq/L   Potassium 4.6 3.5 - 5.1 mEq/L   Chloride 103 96 - 112 mEq/L   CO2 31 19 - 32 mEq/L   Glucose, Bld 104 (H) 70 - 99 mg/dL   BUN 17 6 - 23 mg/dL   Creatinine, Ser 0.86 0.40 - 1.20 mg/dL   Total Bilirubin 0.2 0.2 - 1.2 mg/dL   Alkaline Phosphatase 52 39 - 117 U/L   AST 13 0 - 37 U/L   ALT 14 0 - 35 U/L   Total Protein 7.2 6.0 - 8.3 g/dL   Albumin 4.4 3.5 - 5.2 g/dL   Calcium 10.0 8.4 - 10.5 mg/dL   GFR 69.95 >60.00 mL/min  Lipid panel  Result Value Ref Range   Cholesterol 175 0 - 200 mg/dL   Triglycerides 87.0 0.0 - 149.0 mg/dL   HDL 53.50 >39.00 mg/dL   VLDL 17.4 0.0 - 40.0 mg/dL   LDL  Cholesterol 104 (H) 0 - 99 mg/dL   Total CHOL/HDL Ratio 3    NonHDL 121.83       Assessment & Plan:   Problem List Items Addressed This Visit    Systolic murmur    Chronic, stable. Asxs. Continue to monitor.       Osteopenia    Continue good dietary calcium. Update DEXA.  Completed 5 yrs bisphosphonate.      Relevant Orders   DG Bone Density   Hypertension - Primary    Chronic, stable. Continue current regimen.       Relevant Medications  amLODipine (NORVASC) 2.5 MG tablet   pravastatin (PRAVACHOL) 40 MG tablet   Hyperlipidemia    Chronic stable on pravastatin. The 10-year ASCVD risk score Mikey Bussing DC Brooke Bonito., et al., 2013) is: 8.2%   Values used to calculate the score:     Age: 13 years     Sex: Female     Is Non-Hispanic African American: No     Diabetic: No     Tobacco smoker: No     Systolic Blood Pressure: 956 mmHg     Is BP treated: Yes     HDL Cholesterol: 53.5 mg/dL     Total Cholesterol: 175 mg/dL       Relevant Medications   amLODipine (NORVASC) 2.5 MG tablet   pravastatin (PRAVACHOL) 40 MG tablet   Advanced care planning/counseling discussion    Advanced planning: In chart - HCPOA is husband Richard then [place]. Does not want prolonged life support (10/2014)       Other Visit Diagnoses    Disorder of bone       Relevant Orders   DG Bone Density       Meds ordered this encounter  Medications  . amLODipine (NORVASC) 2.5 MG tablet    Sig: Take 1 tablet (2.5 mg total) by mouth daily.    Dispense:  90 tablet    Refill:  3  . escitalopram (LEXAPRO) 10 MG tablet    Sig: Take 1 tablet (10 mg total) by mouth daily.    Dispense:  90 tablet    Refill:  3  . pravastatin (PRAVACHOL) 40 MG tablet    Sig: TAKE 1 TABLET(40 MG) BY MOUTH DAILY    Dispense:  90 tablet    Refill:  3   Orders Placed This Encounter  Procedures  . DG Bone Density    Standing Status:   Future    Standing Expiration Date:   04/12/2019    Order Specific Question:   Reason for  Exam (SYMPTOM  OR DIAGNOSIS REQUIRED)    Answer:   osteopenia    Order Specific Question:   Preferred imaging location?    [facility]    Follow up plan: No follow-ups on file.  Ria Bush, MD

## 2018-02-09 NOTE — Assessment & Plan Note (Signed)
Chronic, stable. Continue current regimen. 

## 2018-02-09 NOTE — Patient Instructions (Addendum)
We will refer you to breast center for DEXA scan. If interested, check with pharmacy about new 2 shot shingles series (shingrix).  Continue regular walking.  You are doing well today  Return as needed or in 1 year for next wellness visit

## 2018-02-09 NOTE — Assessment & Plan Note (Signed)
Advanced planning: In chart - HCPOA is husband Sheri Patel then Sheri Patel. Does not want prolonged life support (10/2014)

## 2018-02-09 NOTE — Assessment & Plan Note (Signed)
Chronic, stable. Asxs. Continue to monitor.

## 2018-02-09 NOTE — Assessment & Plan Note (Signed)
Continue good dietary calcium. Update DEXA.  Completed 5 yrs bisphosphonate.

## 2018-02-21 NOTE — Progress Notes (Signed)
I reviewed health advisor's note, was available for consultation, and agree with documentation and plan.  

## 2018-03-23 ENCOUNTER — Encounter: Payer: Self-pay | Admitting: Family Medicine

## 2018-03-24 NOTE — Telephone Encounter (Signed)
plz fax latest office visit and med list per pt request. Check with Junie Panning if we need ROI signed first.

## 2018-04-13 ENCOUNTER — Ambulatory Visit
Admission: RE | Admit: 2018-04-13 | Discharge: 2018-04-13 | Disposition: A | Discharge status: Home / Self Care | Payer: Medicare Other | Source: Ambulatory Visit | Attending: Family Medicine | Admitting: Family Medicine

## 2018-04-13 DIAGNOSIS — M899 Disorder of bone, unspecified: Secondary | ICD-10-CM

## 2018-04-13 DIAGNOSIS — Z780 Asymptomatic menopausal state: Secondary | ICD-10-CM | POA: Diagnosis not present

## 2018-04-13 DIAGNOSIS — M8580 Other specified disorders of bone density and structure, unspecified site: Secondary | ICD-10-CM

## 2018-04-13 DIAGNOSIS — M8589 Other specified disorders of bone density and structure, multiple sites: Secondary | ICD-10-CM | POA: Diagnosis not present

## 2018-04-20 NOTE — Telephone Encounter (Signed)
Refill sent.

## 2018-04-24 ENCOUNTER — Encounter: Payer: Self-pay | Admitting: Family Medicine

## 2018-04-26 NOTE — Telephone Encounter (Signed)
Documented pt had Shingrix vaccine on 03/20/18, per her MyChart message.

## 2018-05-15 DIAGNOSIS — Z23 Encounter for immunization: Secondary | ICD-10-CM | POA: Diagnosis not present

## 2018-05-16 ENCOUNTER — Encounter: Payer: Self-pay | Admitting: Family Medicine

## 2018-05-17 NOTE — Telephone Encounter (Signed)
Updated pt's immunizations with the HD flu shot.

## 2018-06-08 DIAGNOSIS — Z79899 Other long term (current) drug therapy: Secondary | ICD-10-CM | POA: Diagnosis not present

## 2018-06-08 DIAGNOSIS — F331 Major depressive disorder, recurrent, moderate: Secondary | ICD-10-CM | POA: Diagnosis not present

## 2018-06-09 DIAGNOSIS — Z9889 Other specified postprocedural states: Secondary | ICD-10-CM | POA: Diagnosis not present

## 2018-06-09 DIAGNOSIS — H43813 Vitreous degeneration, bilateral: Secondary | ICD-10-CM | POA: Diagnosis not present

## 2018-06-09 DIAGNOSIS — H1859 Other hereditary corneal dystrophies: Secondary | ICD-10-CM | POA: Diagnosis not present

## 2018-06-09 DIAGNOSIS — H2513 Age-related nuclear cataract, bilateral: Secondary | ICD-10-CM | POA: Diagnosis not present

## 2018-06-09 DIAGNOSIS — H04123 Dry eye syndrome of bilateral lacrimal glands: Secondary | ICD-10-CM | POA: Diagnosis not present

## 2018-07-16 ENCOUNTER — Encounter: Payer: Self-pay | Admitting: Family Medicine

## 2018-07-16 DIAGNOSIS — F1029 Alcohol dependence with unspecified alcohol-induced disorder: Secondary | ICD-10-CM

## 2018-07-19 MED ORDER — AMLODIPINE BESYLATE 5 MG PO TABS: 5.0000 mg | ORAL_TABLET | Freq: Every day | ORAL | 1 refills | Status: DC

## 2018-07-22 DIAGNOSIS — F1021 Alcohol dependence, in remission: Secondary | ICD-10-CM | POA: Insufficient documentation

## 2018-07-22 DIAGNOSIS — F1020 Alcohol dependence, uncomplicated: Secondary | ICD-10-CM | POA: Insufficient documentation

## 2018-12-31 ENCOUNTER — Encounter: Payer: Self-pay | Admitting: Family Medicine

## 2019-01-03 ENCOUNTER — Other Ambulatory Visit: Payer: Self-pay | Admitting: Family Medicine

## 2019-02-15 ENCOUNTER — Ambulatory Visit: Payer: Medicare Other

## 2019-02-16 ENCOUNTER — Other Ambulatory Visit: Payer: Self-pay | Admitting: Family Medicine

## 2019-02-22 ENCOUNTER — Encounter: Payer: Medicare Other | Admitting: Family Medicine

## 2019-03-07 ENCOUNTER — Encounter: Payer: Self-pay | Admitting: Family Medicine

## 2019-03-30 ENCOUNTER — Other Ambulatory Visit: Payer: Self-pay | Admitting: Family Medicine

## 2019-04-01 ENCOUNTER — Telehealth: Payer: Self-pay

## 2019-04-01 NOTE — Telephone Encounter (Signed)
Left detailed VM w COVID screen and back door lab info and front door info   

## 2019-04-04 ENCOUNTER — Other Ambulatory Visit: Payer: Self-pay | Admitting: Family Medicine

## 2019-04-04 DIAGNOSIS — E785 Hyperlipidemia, unspecified: Secondary | ICD-10-CM

## 2019-04-04 DIAGNOSIS — I10 Essential (primary) hypertension: Secondary | ICD-10-CM

## 2019-04-05 ENCOUNTER — Other Ambulatory Visit: Payer: Self-pay

## 2019-04-05 ENCOUNTER — Other Ambulatory Visit (INDEPENDENT_AMBULATORY_CARE_PROVIDER_SITE_OTHER): Payer: Medicare Other

## 2019-04-05 DIAGNOSIS — E785 Hyperlipidemia, unspecified: Secondary | ICD-10-CM

## 2019-04-05 DIAGNOSIS — I10 Essential (primary) hypertension: Secondary | ICD-10-CM | POA: Diagnosis not present

## 2019-04-05 LAB — COMPREHENSIVE METABOLIC PANEL
ALT: 32 U/L (ref 0–35)
AST: 20 U/L (ref 0–37)
Albumin: 4.3 g/dL (ref 3.5–5.2)
Alkaline Phosphatase: 62 U/L (ref 39–117)
BUN: 17 mg/dL (ref 6–23)
CO2: 30 mEq/L (ref 19–32)
Calcium: 9.3 mg/dL (ref 8.4–10.5)
Chloride: 105 mEq/L (ref 96–112)
Creatinine, Ser: 0.76 mg/dL (ref 0.40–1.20)
GFR: 75.64 mL/min (ref >60.00)
Glucose, Bld: 107 mg/dL — ABNORMAL HIGH (ref 70–99)
Potassium: 4.1 mEq/L (ref 3.5–5.1)
Sodium: 142 mEq/L (ref 135–145)
Total Bilirubin: 0.4 mg/dL (ref 0.2–1.2)
Total Protein: 7 g/dL (ref 6.0–8.3)

## 2019-04-05 LAB — LIPID PANEL
Cholesterol: 167 mg/dL (ref 0–200)
HDL: 45.4 mg/dL (ref >39.00)
LDL Cholesterol: 97 mg/dL (ref 0–99)
NonHDL: 121.16
Total CHOL/HDL Ratio: 4
Triglycerides: 119 mg/dL (ref 0.0–149.0)
VLDL: 23.8 mg/dL (ref 0.0–40.0)

## 2019-04-05 LAB — MICROALBUMIN / CREATININE URINE RATIO
Creatinine,U: 75.9 mg/dL
Microalb Creat Ratio: 1.4 mg/g (ref 0.0–30.0)
Microalb, Ur: 1.1 mg/dL (ref 0.0–1.9)

## 2019-04-11 ENCOUNTER — Ambulatory Visit (INDEPENDENT_AMBULATORY_CARE_PROVIDER_SITE_OTHER)
Admission: RE | Admit: 2019-04-11 | Discharge: 2019-04-11 | Disposition: A | Discharge status: Home / Self Care | Payer: Medicare Other | Source: Ambulatory Visit | Attending: Family Medicine | Admitting: Family Medicine

## 2019-04-11 ENCOUNTER — Ambulatory Visit (INDEPENDENT_AMBULATORY_CARE_PROVIDER_SITE_OTHER): Payer: Medicare Other | Admitting: Family Medicine

## 2019-04-11 ENCOUNTER — Other Ambulatory Visit (HOSPITAL_COMMUNITY)
Admission: RE | Admit: 2019-04-11 | Discharge: 2019-04-11 | Disposition: A | Discharge status: Home / Self Care | Payer: Medicare Other | Source: Ambulatory Visit | Attending: Family Medicine | Admitting: Family Medicine

## 2019-04-11 ENCOUNTER — Other Ambulatory Visit: Payer: Self-pay

## 2019-04-11 ENCOUNTER — Other Ambulatory Visit: Payer: Self-pay | Admitting: Family Medicine

## 2019-04-11 ENCOUNTER — Telehealth: Payer: Self-pay | Admitting: Family Medicine

## 2019-04-11 ENCOUNTER — Encounter: Payer: Self-pay | Admitting: Family Medicine

## 2019-04-11 VITALS — BP 152/98 | HR 84 | Temp 98.4°F | Ht 66.5 in | Wt 150.1 lb

## 2019-04-11 DIAGNOSIS — R011 Cardiac murmur, unspecified: Secondary | ICD-10-CM

## 2019-04-11 DIAGNOSIS — R05 Cough: Secondary | ICD-10-CM | POA: Diagnosis not present

## 2019-04-11 DIAGNOSIS — I10 Essential (primary) hypertension: Secondary | ICD-10-CM

## 2019-04-11 DIAGNOSIS — Z124 Encounter for screening for malignant neoplasm of cervix: Secondary | ICD-10-CM

## 2019-04-11 DIAGNOSIS — R059 Cough, unspecified: Secondary | ICD-10-CM

## 2019-04-11 DIAGNOSIS — F1029 Alcohol dependence with unspecified alcohol-induced disorder: Secondary | ICD-10-CM | POA: Diagnosis not present

## 2019-04-11 DIAGNOSIS — Z8742 Personal history of other diseases of the female genital tract: Secondary | ICD-10-CM

## 2019-04-11 DIAGNOSIS — Z0000 Encounter for general adult medical examination without abnormal findings: Secondary | ICD-10-CM | POA: Diagnosis not present

## 2019-04-11 DIAGNOSIS — E785 Hyperlipidemia, unspecified: Secondary | ICD-10-CM

## 2019-04-11 DIAGNOSIS — Z1231 Encounter for screening mammogram for malignant neoplasm of breast: Secondary | ICD-10-CM

## 2019-04-11 DIAGNOSIS — M8580 Other specified disorders of bone density and structure, unspecified site: Secondary | ICD-10-CM

## 2019-04-11 MED ORDER — TETANUS-DIPHTHERIA TOXOIDS TD 2-2 LF/0.5ML IM SUSP: 0.5000 mL | Freq: Once | INTRAMUSCULAR | 0 refills | Status: AC

## 2019-04-11 MED ORDER — METAMUCIL SMOOTH TEXTURE 58.6 % PO POWD: 1.0000 | Freq: Every day | ORAL | Status: AC

## 2019-04-11 MED ORDER — B COMPLEX VITAMINS PO CAPS: 1.0000 | ORAL_CAPSULE | Freq: Every day | ORAL | Status: DC

## 2019-04-11 NOTE — Progress Notes (Signed)
This visit was conducted in person.  BP (!) 152/98 (BP Location: Left Arm)   Pulse 84   Temp 98.4 F (36.9 C)   Ht 5' 6.5" (1.689 m)   Wt 150 lb 1 oz (68.1 kg)   SpO2 98%   BMI 23.86 kg/m   On repeat also elevated  BP Readings from Last 3 Encounters:  04/11/19 (!) 152/98  02/09/18 124/86  02/04/18 (!) 122/92    CC: AMW Subjective:    Patient ID: Sheri Patel, female    DOB: 03-20-1951, 68 y.o.   MRN: [id number]  HPI: LESHAWN Patel is a 68 y.o. female presenting on 04/11/2019 for Medicare Wellness   Did not see health advisor this year.   Treated by [facility] last year for 6 months for alcohol dependence. Was treated with venlafaxine 75mg  daily, clonidine 0.1mg  daily PRN as well as acamprosate, vivitriol injections, naltrexone (all self stopped this year). Did not feel this was effective for alcohol cravings. Participating in [employer] 15 week research study on theta burst stimulation to reduce alcohol dependence.   Over summer stopped seeing [facility] center - increased drinking since then. 1 bottle/night.   HTN - bp well controlled at home on current regimen.   Cough present over the past year. Worse at night time. No blood in sputum. No fevers/chills, night sweats, weight loss. Started since trip to [place] where she may have gotten RSV bronchitis. No h/o asthma.               Hearing Screening   125Hz  250Hz  500Hz  1000Hz  2000Hz  3000Hz  4000Hz  6000Hz  8000Hz   Right ear:   25 40 25  40    Left ear:   25 25 25   40    Vision Screening Comments: Had eye exam with Dr Katy Fitch in December 2019    Office Visit from 04/11/2019 in [place] at [place]  PHQ-2 Total Score  2      Fall Risk  04/11/2019 02/04/2018 01/27/2018 10/22/2016  Falls in the past year? 0 No No No  Comment - - Emmi Telephone Survey: data to providers prior to load -  Number falls in past yr: 0 - - -  Injury with Fall? 0 - - -      Preventative:  COLONOSCOPY Date: 12/2011 normal ([facility] in [place]) Well woman examat our office 08/2015 WNL, rec rpt pap 2020. Mammogram6/2019WNL - upcoming rpt next month. Self breast exams at homewithout concerns.G3P2 Lung cancer screening - not eligible DEXA Date: 09/2012 T -1.9 at spine, -0.7 at hip, was on fosamax for 5 yrs, stopped 10/2013. DEXA 03/2018 T -1.3 spine.  Flu shot - yearly  Td 2010- desires updated. Will Rx to local pharmacy.  Prevnar4/2018, pneumovax 01/2018 Zostavax - 2012  Shingrix completed 06/2018 Advanced planning: In chart - HCPOA is husband Richard then [place]. Does not want prolonged life support (10/2014) Seat belt use discussed Sunscreen use discussed. No changing moles on skin.  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 Interim medical history since our last visit reviewed. Allergies  and medications reviewed and updated. Outpatient Medications Prior to Visit  Medication Sig Dispense Refill  . amLODipine (NORVASC) 5 MG tablet TAKE 1 TABLET(5 MG) BY MOUTH DAILY 90 tablet 0  . aspirin 81 MG tablet Take 81 mg by mouth every Monday, Wednesday, and Friday.     . cholecalciferol (VITAMIN D) 1000 units tablet Take 1,000 Units by mouth daily.    . cycloSPORINE (RESTASIS) 0.05 % ophthalmic emulsion Place 1 drop into both eyes 2 (two) times daily.    . magnesium 30 MG tablet Take 30 mg by mouth 2 (two) times daily.    . pravastatin (PRAVACHOL) 40 MG tablet TAKE 1 TABLET BY MOUTH DAILY 90 tablet 0  . B Complex Vitamins (VITAMIN B  COMPLEX IJ) Inject as directed.    . cloNIDine (CATAPRES) 0.1 MG tablet Take 1 tablet (0.1 mg total) by mouth daily as needed (withdrawal).    . Naltrexone (VIVITROL) 380 MG SUSR Inject 380 mg into the muscle every 30 (thirty) days.  0  . venlafaxine XR (EFFEXOR XR) 75 MG 24 hr capsule Take 1 capsule (75 mg total) by mouth daily with breakfast.     No facility-administered medications prior to visit.      Per HPI unless specifically indicated in ROS section below Review of Systems Objective:    BP (!) 152/98 (BP Location: Left Arm)   Pulse 84   Temp 98.4 F (36.9 C)   Ht 5' 6.5" (1.689 m)   Wt 150 lb 1 oz (68.1 kg)   SpO2 98%   BMI 23.86 kg/m   Wt Readings from Last 3 Encounters:  04/11/19 150 lb 1 oz (68.1 kg)  02/09/18 145 lb 8 oz (66 kg)  02/04/18 147 lb 8 oz (66.9 kg)    Physical Exam Vitals signs and nursing note reviewed. Exam conducted with a chaperone present.  Constitutional:      General: She is not in acute distress.    Appearance: Normal appearance. She is well-developed. She is not ill-appearing.  HENT:     Head: Normocephalic and atraumatic.     Right Ear: Hearing, tympanic membrane, ear canal and external ear normal.     Left Ear: Hearing, tympanic membrane, ear canal and external ear normal.     Nose: Nose normal.     Mouth/Throat:     Mouth: Mucous membranes are moist.     Pharynx: Oropharynx is clear. Uvula midline. No oropharyngeal exudate or posterior oropharyngeal erythema.  Eyes:     General: No scleral icterus.    Extraocular Movements: Extraocular movements intact.     Conjunctiva/sclera: Conjunctivae normal.     Pupils: Pupils are equal, round, and reactive to light.  Neck:     Musculoskeletal: Normal range of motion and neck supple.     Vascular: No carotid bruit.  Cardiovascular:     Rate and Rhythm: Normal rate and regular rhythm.     Pulses: Normal pulses.          Radial pulses are 2+ on the right side and 2+ on the left side.     Heart  sounds: Murmur (3/6 systolic USB) present.  Pulmonary:     Effort: Pulmonary effort is normal. No respiratory distress.     Breath sounds: Normal breath sounds. No wheezing, rhonchi or rales.  Abdominal:     General: Abdomen is flat. Bowel sounds are normal. There is no distension.     Palpations: Abdomen is soft. There is no mass.  Tenderness: There is no abdominal tenderness. There is no guarding or rebound.     Hernia: No hernia is present. There is no hernia in the left inguinal area or right inguinal area.  Genitourinary:    Exam position: Supine.     Pubic Area: No rash.      Labia:        Right: No rash, tenderness, lesion or injury.        Left: No rash, tenderness, lesion or injury.      Vagina: Normal.     Cervix: Normal.     Uterus: Normal.      Adnexa: Right adnexa normal and left adnexa normal.     Comments:  Pap performed on cervix Vaginal dryness present Musculoskeletal: Normal range of motion.     Right lower leg: No edema.     Left lower leg: No edema.  Lymphadenopathy:     Cervical: No cervical adenopathy.     Lower Body: No right inguinal adenopathy. No left inguinal adenopathy.  Skin:    General: Skin is warm and dry.     Findings: No rash.  Neurological:     General: No focal deficit present.     Mental Status: She is alert and oriented to person, place, and time.     Comments:  CN grossly intact, station and gait intact Recall 3/3 Calculation D-L-R-O-W  Psychiatric:        Mood and Affect: Mood normal.        Behavior: Behavior normal.        Thought Content: Thought content normal.        Judgment: Judgment normal.       Results for orders placed or performed in visit on 04/05/19  Microalbumin / creatinine urine ratio  Result Value Ref Range   Microalb, Ur 1.1 0.0 - 1.9 mg/dL   Creatinine,U 75.9 mg/dL   Microalb Creat Ratio 1.4 0.0 - 30.0 mg/g  Lipid panel  Result Value Ref Range   Cholesterol 167 0 - 200 mg/dL   Triglycerides 119.0 0.0  - 149.0 mg/dL   HDL 45.40 >39.00 mg/dL   VLDL 23.8 0.0 - 40.0 mg/dL   LDL Cholesterol 97 0 - 99 mg/dL   Total CHOL/HDL Ratio 4    NonHDL 121.16   Comprehensive metabolic panel  Result Value Ref Range   Sodium 142 135 - 145 mEq/L   Potassium 4.1 3.5 - 5.1 mEq/L   Chloride 105 96 - 112 mEq/L   CO2 30 19 - 32 mEq/L   Glucose, Bld 107 (H) 70 - 99 mg/dL   BUN 17 6 - 23 mg/dL   Creatinine, Ser 0.76 0.40 - 1.20 mg/dL   Total Bilirubin 0.4 0.2 - 1.2 mg/dL   Alkaline Phosphatase 62 39 - 117 U/L   AST 20 0 - 37 U/L   ALT 32 0 - 35 U/L   Total Protein 7.0 6.0 - 8.3 g/dL   Albumin 4.3 3.5 - 5.2 g/dL   Calcium 9.3 8.4 - 10.5 mg/dL   GFR 75.64 >60.00 mL/min   Assessment & Plan:   Problem List Items Addressed This Visit    Systolic murmur    Chronic, may be louder - will check baseline echocardiogram.       Relevant Orders   ECHOCARDIOGRAM COMPLETE   Osteopenia    Chronic, improved. Encouraged regular weight bearing exercise as well as continued cal/vit D.       Medicare annual wellness visit, subsequent -  Primary    I have personally reviewed the Medicare Annual Wellness questionnaire and have noted 1. The patient's medical and social history 2. Their use of alcohol, tobacco or illicit drugs 3. Their current medications and supplements 4. The patient's functional ability including ADL's, fall risks, home safety risks and hearing or visual impairment. Cognitive function has been assessed and addressed as indicated.  5. Diet and physical activity 6. Evidence for depression or mood disorders The patients weight, height, BMI have been recorded in the chart. I have made referrals, counseling and provided education to the patient based on review of the above and I have provided the pt with a written personalized care plan for preventive services. Provider list updated.. See scanned questionairre as needed for further documentation. Reviewed preventative protocols and updated unless pt  declined.       Hypertension    Chronic, deteriorated despite amlodipine 5mg  daily. She will start monitoring more closely at home and let us know if persistently elevated to titrate treatment.       Hyperlipidemia    Chronic, stable. Continue pravastatin.  The 10-year ASCVD risk score Mikey Bussing DC Brooke Bonito., et al., 2013) is: 13.9%   Values used to calculate the score:     Age: 60 years     Sex: Female     Is Non-Hispanic African American: No     Diabetic: No     Tobacco smoker: No     Systolic Blood Pressure: 0000000 mmHg     Is BP treated: Yes     HDL Cholesterol: 45.4 mg/dL     Total Cholesterol: 167 mg/dL       History of abnormal cervical Pap smear    Update today. If normal, consider discontinuing       Cough    Longstanding cough present for the past year. No red flags. Lung exam WNL. Check baseline CXR.       Relevant Orders   DG Chest 2 View   Alcohol dependence ([place])    Underwent treatment at [facility] for alcohol dependence earlier this year. Now off all treatments, but desires further eval/treatment. May be interested in intensive outpatient rehab program - will refer for this. She has also signed up for research study through [employer]. Discussed involvement in AA - she would like to defer until she completes alcohol cessation study.       Relevant Orders   Ambulatory referral to Psychology    Other Visit Diagnoses    Cervical cancer screening       Relevant Orders   Cytology - PAP([place])       Meds ordered this encounter  Medications  . b complex vitamins capsule    Sig: Take 1 capsule by mouth daily.    Dispense:     . diptheria-tetanus toxoids (DECAVAC) 2-2 LF/0.5ML injection    Sig: Inject 0.5 mLs into the muscle once for 1 dose.    Dispense:  0.5 mL    Refill:  0  . psyllium (METAMUCIL SMOOTH TEXTURE) 58.6 % powder    Sig: Take 1 packet by mouth daily.   Orders Placed This Encounter  Procedures  . DG Chest 2 View    Standing  Status:   Future    Number of Occurrences:   1    Standing Expiration Date:   06/10/2020    Order Specific Question:   Reason for Exam (SYMPTOM  OR DIAGNOSIS REQUIRED)    Answer:   chronic cough  Order Specific Question:   Preferred imaging location?    Answer:   [facility]    Order Specific Question:   Radiology Contrast Protocol - do NOT remove file path    Answer:   [email or web address]  . Ambulatory referral to Psychology    Referral Priority:   Routine    Referral Type:   Psychiatric    Referral Reason:   Specialty Services Required    Requested Specialty:   Psychology    Number of Visits Requested:   1  . ECHOCARDIOGRAM COMPLETE    Standing Status:   Future    Standing Expiration Date:   07/11/2020    Order Specific Question:   Where should this test be performed    Answer:   [facility])    Order Specific Question:   Does the patient weigh less than or greater than 250 lbs?    Answer:   Patient weighs less than 250 lbs    Order Specific Question:   Perflutren DEFINITY (image enhancing agent) should be administered unless hypersensitivity or allergy exist    Answer:   Administer Perflutren    Order Specific Question:   Reason for exam-Echo    Answer:   Murmur  785.2 / R01.1   Patient instructions: We will refer you to substance dependence center in [facility] Susa Loffler).  We will call you to schedule heart ultrasound in [place].  Xray today.  Good to see you today, call us with questions.  Return in 3-4 months for follow up visit.   Follow up plan: Return in 3 months (on 07/12/2019), or if symptoms worsen or fail to improve, for follow up visit.  Ria Bush, MD

## 2019-04-11 NOTE — Telephone Encounter (Signed)
plz call to schedule 4 mo f/u visit. I also forgot to mention - but would she be interested in restarting lexapro for mood with recheck at next visit?

## 2019-04-11 NOTE — Assessment & Plan Note (Signed)
Chronic, may be louder - will check baseline echocardiogram.

## 2019-04-11 NOTE — Assessment & Plan Note (Addendum)
Longstanding cough present for the past year. No red flags. Lung exam WNL. Check baseline CXR.

## 2019-04-11 NOTE — Assessment & Plan Note (Signed)
Chronic, stable. Continue pravastatin.  The 10-year ASCVD risk score Mikey Bussing DC Brooke Bonito., et al., 2013) is: 13.9%   Values used to calculate the score:     Age: 68 years     Sex: Female     Is Non-Hispanic African American: No     Diabetic: No     Tobacco smoker: No     Systolic Blood Pressure: 0000000 mmHg     Is BP treated: Yes     HDL Cholesterol: 45.4 mg/dL     Total Cholesterol: 167 mg/dL

## 2019-04-11 NOTE — Assessment & Plan Note (Addendum)
Update today. If normal, consider discontinuing

## 2019-04-11 NOTE — Assessment & Plan Note (Signed)
Chronic, improved. Encouraged regular weight bearing exercise as well as continued cal/vit D.

## 2019-04-11 NOTE — Patient Instructions (Addendum)
We will refer you to substance dependence center in [facility] Sus[place] Loffler).  We will call you to schedule heart ultrasound in [place].  Xray today.  Good to see you today, call us with questions.  Return in 3-4 months for follow up visit.   Health Maintenance After Age 68 After age 37, you are at a higher risk for certain long-term diseases and infections as well as injuries from falls. Falls are a major cause of broken bones and head injuries in people who are older than age 81. Getting regular preventive care can help to keep you healthy and well. Preventive care includes getting regular testing and making lifestyle changes as recommended by your health care provider. Talk with your health care provider about:  Which screenings and tests you should have. A screening is a test that checks for a disease when you have no symptoms.  A diet and exercise plan that is right for you. What should I know about screenings and tests to prevent falls? Screening and testing are the best ways to find a health problem early. Early diagnosis and treatment give you the best chance of managing medical conditions that are common after age 20. Certain conditions and lifestyle choices may make you more likely to have a fall. Your health care provider may recommend:  Regular vision checks. Poor vision and conditions such as cataracts can make you more likely to have a fall. If you wear glasses, make sure to get your prescription updated if your vision changes.  Medicine review. Work with your health care provider to regularly review all of the medicines you are taking, including over-the-counter medicines. Ask your health care provider about any side effects that may make you more likely to have a fall. Tell your health care provider if any medicines that you take make you feel dizzy or sleepy.  Osteoporosis screening. Osteoporosis is a condition that causes the bones to get weaker. This can make the bones weak  and cause them to break more easily.  Blood pressure screening. Blood pressure changes and medicines to control blood pressure can make you feel dizzy.  Strength and balance checks. Your health care provider may recommend certain tests to check your strength and balance while standing, walking, or changing positions.  Foot health exam. Foot pain and numbness, as well as not wearing proper footwear, can make you more likely to have a fall.  Depression screening. You may be more likely to have a fall if you have a fear of falling, feel emotionally low, or feel unable to do activities that you used to do.  Alcohol use screening. Using too much alcohol can affect your balance and may make you more likely to have a fall. What actions can I take to lower my risk of falls? General instructions  Talk with your health care provider about your risks for falling. Tell your health care provider if: ? You fall. Be sure to tell your health care provider about all falls, even ones that seem minor. ? You feel dizzy, sleepy, or off-balance.  Take over-the-counter and prescription medicines only as told by your health care provider. These include any supplements.  Eat a healthy diet and maintain a healthy weight. A healthy diet includes low-fat dairy products, low-fat (lean) meats, and fiber from whole grains, beans, and lots of fruits and vegetables. Home safety  Remove any tripping hazards, such as rugs, cords, and clutter.  Install safety equipment such as grab bars in bathrooms and safety  rails on stairs.  Keep rooms and walkways well-lit. Activity   Follow a regular exercise program to stay fit. This will help you maintain your balance. Ask your health care provider what types of exercise are appropriate for you.  If you need a cane or walker, use it as recommended by your health care provider.  Wear supportive shoes that have nonskid soles. Lifestyle  Do not drink alcohol if your health  care provider tells you not to drink.  If you drink alcohol, limit how much you have: ? 0-1 drink a day for women. ? 0-2 drinks a day for men.  Be aware of how much alcohol is in your drink. In the [place] one drink equals one typical bottle of beer (12 oz), one-half glass of wine (5 oz), or one shot of hard liquor (1 oz).  Do not use any products that contain nicotine or tobacco, such as cigarettes and e-cigarettes. If you need help quitting, ask your health care provider. Summary  Having a healthy lifestyle and getting preventive care can help to protect your health and wellness after age 58.  Screening and testing are the best way to find a health problem early and help you avoid having a fall. Early diagnosis and treatment give you the best chance for managing medical conditions that are more common for people who are older than age 55.  Falls are a major cause of broken bones and head injuries in people who are older than age 35. Take precautions to prevent a fall at home.  Work with your health care provider to learn what changes you can make to improve your health and wellness and to prevent falls. This information is not intended to replace advice given to you by your health care provider. Make sure you discuss any questions you have with your health care provider. Document Released: 04/29/2017 Document Revised: 10/07/2018 Document Reviewed: 04/29/2017 Elsevier Patient Education  2020 [employer].

## 2019-04-11 NOTE — Assessment & Plan Note (Signed)
Underwent treatment at [facility] for alcohol dependence earlier this year. Now off all treatments, but desires further eval/treatment. May be interested in intensive outpatient rehab program - will refer for this. She has also signed up for research study through [employer]. Discussed involvement in AA - she would like to defer until she completes alcohol cessation study.

## 2019-04-11 NOTE — Assessment & Plan Note (Signed)

## 2019-04-11 NOTE — Assessment & Plan Note (Signed)
Chronic, deteriorated despite amlodipine 5mg  daily. She will start monitoring more closely at home and let us know if persistently elevated to titrate treatment.

## 2019-04-12 ENCOUNTER — Encounter: Payer: Medicare Other | Admitting: Family Medicine

## 2019-04-12 NOTE — Telephone Encounter (Signed)
Left message for patient to call back  

## 2019-04-13 ENCOUNTER — Telehealth: Payer: Self-pay | Admitting: Family Medicine

## 2019-04-13 NOTE — Telephone Encounter (Signed)
error 

## 2019-04-13 NOTE — Telephone Encounter (Signed)
Patient called back to schedule 4 mo f/u. She also stated that at this time she did not want to start the Lexapro

## 2019-04-13 NOTE — Telephone Encounter (Signed)
Left message on vm per dpr asking pt to call back to schedule 4 mo f/u visit.  Also, asked pt to let Dr. Darnell Level know if she wants to restart Lexapro.

## 2019-04-13 NOTE — Telephone Encounter (Signed)
Noted. Thanks.

## 2019-04-18 ENCOUNTER — Encounter: Payer: Self-pay | Admitting: Family Medicine

## 2019-04-19 ENCOUNTER — Ambulatory Visit (HOSPITAL_COMMUNITY): Payer: Medicare Other | Attending: Cardiology

## 2019-04-19 ENCOUNTER — Other Ambulatory Visit: Payer: Self-pay

## 2019-04-19 DIAGNOSIS — R011 Cardiac murmur, unspecified: Secondary | ICD-10-CM | POA: Insufficient documentation

## 2019-04-19 MED ORDER — AMLODIPINE BESYLATE 10 MG PO TABS: 10.0000 mg | ORAL_TABLET | Freq: Every day | ORAL | 1 refills | Status: DC

## 2019-04-21 ENCOUNTER — Encounter: Payer: Self-pay | Admitting: Family Medicine

## 2019-04-22 LAB — CYTOLOGY - PAP
Comment: NEGATIVE
Diagnosis: NEGATIVE
High risk HPV: NEGATIVE

## 2019-05-06 ENCOUNTER — Encounter: Payer: Self-pay | Admitting: Family Medicine

## 2019-05-06 NOTE — Telephone Encounter (Signed)
Updated pt's immunizations. Notified pt via MyChart.  

## 2019-05-12 ENCOUNTER — Encounter: Payer: Self-pay | Admitting: Family Medicine

## 2019-05-12 MED ORDER — HYDROCHLOROTHIAZIDE 12.5 MG PO CAPS: 12.5000 mg | ORAL_CAPSULE | Freq: Every day | ORAL | 6 refills | Status: DC

## 2019-05-12 MED ORDER — AMLODIPINE BESYLATE 5 MG PO TABS: 5.0000 mg | ORAL_TABLET | Freq: Every day | ORAL | 6 refills | Status: DC

## 2019-05-12 NOTE — Telephone Encounter (Signed)
Pt left v/m with same info as pt message.

## 2019-05-17 ENCOUNTER — Other Ambulatory Visit: Payer: Self-pay

## 2019-05-17 MED ORDER — PRAVASTATIN SODIUM 40 MG PO TABS: ORAL_TABLET | ORAL | 0 refills | Status: DC

## 2019-05-24 ENCOUNTER — Ambulatory Visit
Admission: RE | Admit: 2019-05-24 | Discharge: 2019-05-24 | Disposition: A | Discharge status: Home / Self Care | Payer: Medicare Other | Source: Ambulatory Visit | Attending: Family Medicine | Admitting: Family Medicine

## 2019-05-24 ENCOUNTER — Other Ambulatory Visit: Payer: Self-pay

## 2019-05-24 DIAGNOSIS — Z1231 Encounter for screening mammogram for malignant neoplasm of breast: Secondary | ICD-10-CM | POA: Diagnosis not present

## 2019-05-25 LAB — HM MAMMOGRAPHY

## 2019-05-30 ENCOUNTER — Encounter: Payer: Self-pay | Admitting: Family Medicine

## 2019-07-18 ENCOUNTER — Encounter: Payer: Self-pay | Admitting: Family Medicine

## 2019-07-18 NOTE — Telephone Encounter (Signed)
Dr. Darnell Level are you aware of this process? I am not sure

## 2019-08-04 ENCOUNTER — Ambulatory Visit: Payer: Medicare Other

## 2019-08-06 ENCOUNTER — Ambulatory Visit: Payer: Medicare Other

## 2019-08-09 ENCOUNTER — Ambulatory Visit: Payer: Medicare Other

## 2019-08-11 ENCOUNTER — Other Ambulatory Visit: Payer: Self-pay | Admitting: Family Medicine

## 2019-08-16 ENCOUNTER — Ambulatory Visit: Payer: Medicare Other | Admitting: Family Medicine

## 2019-09-13 ENCOUNTER — Ambulatory Visit (INDEPENDENT_AMBULATORY_CARE_PROVIDER_SITE_OTHER): Payer: Medicare Other | Admitting: Family Medicine

## 2019-09-13 ENCOUNTER — Encounter: Payer: Self-pay | Admitting: Family Medicine

## 2019-09-13 ENCOUNTER — Other Ambulatory Visit: Payer: Self-pay

## 2019-09-13 VITALS — BP 132/78 | HR 85 | Temp 97.7°F | Ht 66.5 in | Wt 148.4 lb

## 2019-09-13 DIAGNOSIS — F1029 Alcohol dependence with unspecified alcohol-induced disorder: Secondary | ICD-10-CM | POA: Diagnosis not present

## 2019-09-13 DIAGNOSIS — I10 Essential (primary) hypertension: Secondary | ICD-10-CM | POA: Diagnosis not present

## 2019-09-13 DIAGNOSIS — I358 Other nonrheumatic aortic valve disorders: Secondary | ICD-10-CM | POA: Diagnosis not present

## 2019-09-13 LAB — CBC WITH DIFFERENTIAL/PLATELET
Basophils Absolute: 0 10*3/uL (ref 0.0–0.1)
Basophils Relative: 0.5 % (ref 0.0–3.0)
Eosinophils Absolute: 0.2 10*3/uL (ref 0.0–0.7)
Eosinophils Relative: 5.4 % — ABNORMAL HIGH (ref 0.0–5.0)
HCT: 40.6 % (ref 36.0–46.0)
Hemoglobin: 13.6 g/dL (ref 12.0–15.0)
Lymphocytes Relative: 22.1 % (ref 12.0–46.0)
Lymphs Abs: 1 10*3/uL (ref 0.7–4.0)
MCHC: 33.4 g/dL (ref 30.0–36.0)
MCV: 89.1 fl (ref 78.0–100.0)
Monocytes Absolute: 0.6 10*3/uL (ref 0.1–1.0)
Monocytes Relative: 12 % (ref 3.0–12.0)
Neutro Abs: 2.7 10*3/uL (ref 1.4–7.7)
Neutrophils Relative %: 60 % (ref 43.0–77.0)
Platelets: 271 10*3/uL (ref 150.0–400.0)
RBC: 4.55 Mil/uL (ref 3.87–5.11)
RDW: 13.4 % (ref 11.5–15.5)
WBC: 4.6 10*3/uL (ref 4.0–10.5)

## 2019-09-13 LAB — BASIC METABOLIC PANEL
BUN: 17 mg/dL (ref 6–23)
CO2: 31 mEq/L (ref 19–32)
Calcium: 9.5 mg/dL (ref 8.4–10.5)
Chloride: 100 mEq/L (ref 96–112)
Creatinine, Ser: 0.78 mg/dL (ref 0.40–1.20)
GFR: 73.31 mL/min (ref >60.00)
Glucose, Bld: 97 mg/dL (ref 70–99)
Potassium: 3.8 mEq/L (ref 3.5–5.1)
Sodium: 138 mEq/L (ref 135–145)

## 2019-09-13 MED ORDER — AMLODIPINE BESYLATE 5 MG PO TABS: 5.0000 mg | ORAL_TABLET | Freq: Every day | ORAL | 1 refills | Status: DC

## 2019-09-13 MED ORDER — HYDROCHLOROTHIAZIDE 12.5 MG PO CAPS: 12.5000 mg | ORAL_CAPSULE | Freq: Every day | ORAL | 1 refills | Status: DC

## 2019-09-13 NOTE — Patient Instructions (Signed)
Labs today  You are doing well today Continue current medicines. Return as needed or in 6 months for follow up visit.

## 2019-09-13 NOTE — Assessment & Plan Note (Addendum)
Chronic. Well controlled on current regimen. Continue amlodipine 5mg  and hctz 12.5mg .  Update BMP on HCTZ.

## 2019-09-13 NOTE — Progress Notes (Signed)
This visit was conducted in person.  BP 132/78 (BP Location: Left Arm, Patient Position: Sitting, Cuff Size: Normal)   Pulse 85   Temp 97.7 F (36.5 C) (Temporal)   Ht 5' 6.5" (1.689 m)   Wt 148 lb 7 oz (67.3 kg)   SpO2 94%   BMI 23.60 kg/m    CC: HTN, alcohol f/u Subjective:    Patient ID: Sheri Patel, female    DOB: 1951-03-02, 69 y.o.   MRN: [id number]  HPI: Sheri Patel is a 69 y.o. female presenting on 09/13/2019 for Follow-up (Here for HTN and alcohol f/u.)   HTN - on amlodipine 5mg  daily with hctz 12.5mg  daily. No HA, vision changes, CP/tightness, SOB, leg swelling.   Alcohol use - gradually reducing intake - from 1 bottle/night down to 2 glasses of wine. Previously saw [facility] and was on effexor, clonidine, acamprosate, vivitriol, naltrexone. Didn't feel this helped. She also participated in 15 wk research study via [place] on theta burst stimulation. Hasn't tried AA.   Feels mood is doing well, better than last year.   2021 is a better year - donating platelets at [facility], volunteering, [facility], going to travel and visit children.   She completed Pfizer vaccines.      Relevant past medical, surgical, family and social history reviewed and updated as indicated. Interim medical history since our last visit reviewed. Allergies and medications reviewed and updated. Outpatient Medications Prior to Visit  Medication Sig Dispense Refill  . aspirin 81 MG tablet Take 81 mg by mouth every Monday, Wednesday, and Friday.     Marland Kitchen b complex vitamins capsule Take 1 capsule by mouth daily.    . calcium carbonate (TUMS EX) 750 MG chewable tablet Chew by mouth.     . Cholecalciferol (VITAMIN D3) 25 MCG (1000 UT) CAPS Take 1 capsule by mouth daily.    . cycloSPORINE (RESTASIS) 0.05 % ophthalmic emulsion Place 1 drop into both eyes 2 (two) times daily.    . ferrous sulfate 325 (65 FE) MG tablet Take 325 mg by mouth daily.    . Magnesium 250 MG  TABS Take 1 tablet by mouth daily.     . pravastatin (PRAVACHOL) 40 MG tablet TAKE 1 TABLET BY MOUTH EVERY DAY 90 tablet 2  . psyllium (METAMUCIL SMOOTH TEXTURE) 58.6 % powder Take 1 packet by mouth daily.    Marland Kitchen amLODipine (NORVASC) 5 MG tablet Take 1 tablet (5 mg total) by mouth daily. 30 tablet 6  . hydrochlorothiazide (MICROZIDE) 12.5 MG capsule Take 1 capsule (12.5 mg total) by mouth daily. 30 capsule 6  . cholecalciferol (VITAMIN D) 1000 units tablet Take 1,000 Units by mouth daily.    . magnesium 30 MG tablet Take 30 mg by mouth 2 (two) times daily.     No facility-administered medications prior to visit.     Per HPI unless specifically indicated in ROS section below Review of Systems Objective:    BP 132/78 (BP Location: Left Arm, Patient Position: Sitting, Cuff Size: Normal)   Pulse 85   Temp 97.7 F (36.5 C) (Temporal)   Ht 5' 6.5" (1.689 m)   Wt 148 lb 7 oz (67.3 kg)   SpO2 94%   BMI 23.60 kg/m   Wt Readings from Last 3 Encounters:  09/13/19 148 lb 7 oz (67.3 kg)  04/11/19 150 lb 1 oz (68.1 kg)  02/09/18 145 lb 8 oz (66 kg)    Physical Exam  Vitals and nursing note reviewed.  Constitutional:      Appearance: Normal appearance. She is not ill-appearing.  Cardiovascular:     Rate and Rhythm: Normal rate and regular rhythm.     Pulses: Normal pulses.     Heart sounds: Murmur (3/6 systolic - known aortic sclerosis) present.  Pulmonary:     Effort: Pulmonary effort is normal. No respiratory distress.     Breath sounds: Normal breath sounds. No wheezing, rhonchi or rales.  Musculoskeletal:     Right lower leg: No edema.     Left lower leg: No edema.  Neurological:     Mental Status: She is alert.       Results for orders placed or performed in visit on 05/30/19  HM MAMMOGRAPHY  Result Value Ref Range   HM Mammogram 0-4 Bi-Rad 0-4 Bi-Rad, Self Reported Normal   Depression screen [facility] 2/9 09/13/2019 04/11/2019 02/04/2018 10/22/2016  Decreased Interest 0 0 0 0  Down,  Depressed, Hopeless 0 2 0 0  PHQ - 2 Score 0 2 0 0  Altered sleeping 1 2 0 -  Tired, decreased energy 1 2 0 -  Change in appetite 1 2 0 -  Feeling bad or failure about yourself  1 2 0 -  Trouble concentrating 0 2 0 -  Moving slowly or fidgety/restless 0 0 0 -  Suicidal thoughts 0 0 0 -  PHQ-9 Score 4 12 0 -  Difficult doing work/chores - Somewhat difficult Not difficult at all -   No flowsheet data found.  Assessment & Plan:  This visit occurred during the SARS-CoV-2 public health emergency.  Safety protocols were in place, including screening questions prior to the visit, additional usage of staff PPE, and extensive cleaning of exam room while observing appropriate contact time as indicated for disinfecting solutions.   Problem List Items Addressed This Visit    Hypertension - Primary    Chronic. Well controlled on current regimen. Continue amlodipine 5mg  and hctz 12.5mg .  Update BMP on HCTZ.       Relevant Medications   hydrochlorothiazide (MICROZIDE) 12.5 MG capsule   amLODipine (NORVASC) 5 MG tablet   Other Relevant Orders   CBC with Differential/Platelet   Basic metabolic panel   Aortic valve sclerosis    Stable. Reassuring echo 03/2019.       Relevant Medications   hydrochlorothiazide (MICROZIDE) 12.5 MG capsule   amLODipine (NORVASC) 5 MG tablet   Alcohol dependence (HCC)    Stable to improved period with improvement in mood. Has regular exercise routine, has been able to see family, has started volunteering and donating platelets again. Encouraged continued efforts at alcohol reduction. She has tried multiple alcohol treatments with poor effects in the past.           Meds ordered this encounter  Medications  . hydrochlorothiazide (MICROZIDE) 12.5 MG capsule    Sig: Take 1 capsule (12.5 mg total) by mouth daily.    Dispense:  90 capsule    Refill:  1  . amLODipine (NORVASC) 5 MG tablet    Sig: Take 1 tablet (5 mg total) by mouth daily.    Dispense:  90 tablet     Refill:  1   Orders Placed This Encounter  Procedures  . CBC with Differential/Platelet  . Basic metabolic panel   Patient Instructions  Labs today  You are doing well today Continue current medicines. Return as needed or in 6 months for follow up visit.  Follow up plan: Return in about 6 months (around 03/15/2020) for medicare wellness visit.  Ria Bush, MD

## 2019-09-13 NOTE — Assessment & Plan Note (Signed)
Stable to improved period with improvement in mood. Has regular exercise routine, has been able to see family, has started volunteering and donating platelets again. Encouraged continued efforts at alcohol reduction. She has tried multiple alcohol treatments with poor effects in the past.

## 2019-09-13 NOTE — Assessment & Plan Note (Signed)
Stable. Reassuring echo 03/2019.

## 2019-10-17 DIAGNOSIS — Z1283 Encounter for screening for malignant neoplasm of skin: Secondary | ICD-10-CM | POA: Diagnosis not present

## 2019-10-17 DIAGNOSIS — D225 Melanocytic nevi of trunk: Secondary | ICD-10-CM | POA: Diagnosis not present

## 2019-10-17 DIAGNOSIS — L821 Other seborrheic keratosis: Secondary | ICD-10-CM | POA: Diagnosis not present

## 2020-02-08 DIAGNOSIS — Z20828 Contact with and (suspected) exposure to other viral communicable diseases: Secondary | ICD-10-CM | POA: Diagnosis not present

## 2020-02-29 ENCOUNTER — Other Ambulatory Visit: Payer: Self-pay | Admitting: Family Medicine

## 2020-03-06 DIAGNOSIS — H43813 Vitreous degeneration, bilateral: Secondary | ICD-10-CM | POA: Diagnosis not present

## 2020-03-06 DIAGNOSIS — Z9889 Other specified postprocedural states: Secondary | ICD-10-CM | POA: Diagnosis not present

## 2020-03-06 DIAGNOSIS — H16223 Keratoconjunctivitis sicca, not specified as Sjogren's, bilateral: Secondary | ICD-10-CM | POA: Diagnosis not present

## 2020-03-06 DIAGNOSIS — H2513 Age-related nuclear cataract, bilateral: Secondary | ICD-10-CM | POA: Diagnosis not present

## 2020-03-06 DIAGNOSIS — H18593 Other hereditary corneal dystrophies, bilateral: Secondary | ICD-10-CM | POA: Diagnosis not present

## 2020-03-06 DIAGNOSIS — D3132 Benign neoplasm of left choroid: Secondary | ICD-10-CM | POA: Diagnosis not present

## 2020-04-09 ENCOUNTER — Other Ambulatory Visit: Payer: Self-pay | Admitting: Family Medicine

## 2020-04-09 DIAGNOSIS — F1029 Alcohol dependence with unspecified alcohol-induced disorder: Secondary | ICD-10-CM

## 2020-04-09 DIAGNOSIS — I10 Essential (primary) hypertension: Secondary | ICD-10-CM

## 2020-04-09 DIAGNOSIS — E785 Hyperlipidemia, unspecified: Secondary | ICD-10-CM

## 2020-04-12 ENCOUNTER — Ambulatory Visit: Payer: Medicare Other

## 2020-04-12 ENCOUNTER — Other Ambulatory Visit: Payer: Medicare Other

## 2020-04-17 ENCOUNTER — Ambulatory Visit: Payer: Medicare Other

## 2020-04-17 ENCOUNTER — Ambulatory Visit: Payer: Medicare Other | Admitting: Family Medicine

## 2020-04-20 ENCOUNTER — Encounter: Payer: Self-pay | Admitting: Family Medicine

## 2020-04-20 NOTE — Telephone Encounter (Signed)
Updated pt's chart.  

## 2020-05-06 ENCOUNTER — Encounter: Payer: Self-pay | Admitting: Family Medicine

## 2020-05-06 DIAGNOSIS — Z23 Encounter for immunization: Secondary | ICD-10-CM | POA: Diagnosis not present

## 2020-05-07 NOTE — Telephone Encounter (Signed)
Updated pt's chart.  

## 2020-05-16 ENCOUNTER — Other Ambulatory Visit (INDEPENDENT_AMBULATORY_CARE_PROVIDER_SITE_OTHER): Payer: Medicare Other

## 2020-05-16 ENCOUNTER — Other Ambulatory Visit: Payer: Self-pay

## 2020-05-16 ENCOUNTER — Ambulatory Visit (INDEPENDENT_AMBULATORY_CARE_PROVIDER_SITE_OTHER): Payer: Medicare Other

## 2020-05-16 DIAGNOSIS — E785 Hyperlipidemia, unspecified: Secondary | ICD-10-CM

## 2020-05-16 DIAGNOSIS — F1029 Alcohol dependence with unspecified alcohol-induced disorder: Secondary | ICD-10-CM

## 2020-05-16 DIAGNOSIS — F1011 Alcohol abuse, in remission: Secondary | ICD-10-CM

## 2020-05-16 DIAGNOSIS — I10 Essential (primary) hypertension: Secondary | ICD-10-CM

## 2020-05-16 DIAGNOSIS — Z0000 Encounter for general adult medical examination without abnormal findings: Secondary | ICD-10-CM

## 2020-05-16 LAB — COMPREHENSIVE METABOLIC PANEL
ALT: 16 U/L (ref 0–35)
AST: 15 U/L (ref 0–37)
Albumin: 4.5 g/dL (ref 3.5–5.2)
Alkaline Phosphatase: 51 U/L (ref 39–117)
BUN: 17 mg/dL (ref 6–23)
CO2: 32 mEq/L (ref 19–32)
Calcium: 9.3 mg/dL (ref 8.4–10.5)
Chloride: 98 mEq/L (ref 96–112)
Creatinine, Ser: 0.84 mg/dL (ref 0.40–1.20)
GFR: 70.96 mL/min (ref >60.00)
Glucose, Bld: 105 mg/dL — ABNORMAL HIGH (ref 70–99)
Potassium: 3.8 mEq/L (ref 3.5–5.1)
Sodium: 137 mEq/L (ref 135–145)
Total Bilirubin: 0.5 mg/dL (ref 0.2–1.2)
Total Protein: 7.3 g/dL (ref 6.0–8.3)

## 2020-05-16 LAB — MICROALBUMIN / CREATININE URINE RATIO
Creatinine,U: 100.1 mg/dL
Microalb Creat Ratio: 0.7 mg/g (ref 0.0–30.0)
Microalb, Ur: <0.7 mg/dL (ref 0.0–1.9)

## 2020-05-16 LAB — CBC WITH DIFFERENTIAL/PLATELET
Basophils Absolute: 0 10*3/uL (ref 0.0–0.1)
Basophils Relative: 0.8 % (ref 0.0–3.0)
Eosinophils Absolute: 0.2 10*3/uL (ref 0.0–0.7)
Eosinophils Relative: 5.5 % — ABNORMAL HIGH (ref 0.0–5.0)
HCT: 41.5 % (ref 36.0–46.0)
Hemoglobin: 14.2 g/dL (ref 12.0–15.0)
Lymphocytes Relative: 23.8 % (ref 12.0–46.0)
Lymphs Abs: 1 10*3/uL (ref 0.7–4.0)
MCHC: 34.1 g/dL (ref 30.0–36.0)
MCV: 88.5 fl (ref 78.0–100.0)
Monocytes Absolute: 0.5 10*3/uL (ref 0.1–1.0)
Monocytes Relative: 11.5 % (ref 3.0–12.0)
Neutro Abs: 2.4 10*3/uL (ref 1.4–7.7)
Neutrophils Relative %: 58.4 % (ref 43.0–77.0)
Platelets: 313 10*3/uL (ref 150.0–400.0)
RBC: 4.69 Mil/uL (ref 3.87–5.11)
RDW: 13.3 % (ref 11.5–15.5)
WBC: 4.2 10*3/uL (ref 4.0–10.5)

## 2020-05-16 LAB — LIPID PANEL
Cholesterol: 169 mg/dL (ref 0–200)
HDL: 51.2 mg/dL (ref >39.00)
LDL Cholesterol: 101 mg/dL — ABNORMAL HIGH (ref 0–99)
NonHDL: 117.64
Total CHOL/HDL Ratio: 3
Triglycerides: 83 mg/dL (ref 0.0–149.0)
VLDL: 16.6 mg/dL (ref 0.0–40.0)

## 2020-05-16 LAB — VITAMIN B12: Vitamin B-12: 264 pg/mL (ref 211–911)

## 2020-05-16 NOTE — Progress Notes (Signed)
PCP notes:  Health Maintenance: Dexa- due   Abnormal Screenings: none   Patient concerns: Hearing difficulties   Nurse concerns: none   Next PCP appt.: 05/23/2020 @ 3 pm

## 2020-05-16 NOTE — Progress Notes (Signed)
Subjective:   Sheri Patel is a 69 y.o. female who presents for Medicare Annual (Subsequent) preventive examination.  Review of Systems: N/A      I connected with the patient today by telephone and verified that I am speaking with the correct person using two identifiers. Location patient: home Location nurse: work Persons participating in the telephone visit: patient, nurse.   I discussed the limitations, risks, security and privacy concerns of performing an evaluation and management service by telephone and the availability of in person appointments. I also discussed with the patient that there may be a patient responsible charge related to this service. The patient expressed understanding and verbally consented to this telephonic visit.        Cardiac Risk Factors include: advanced age (>65men, >57 women);hypertension;Other (see comment), Risk factor comments: hyperlipidemia     Objective:    Today's Vitals   There is no height or weight on file to calculate BMI.  Advanced Directives 05/16/2020 02/04/2018  Does Patient Have a Medical Advance Directive? Yes Yes  Type of Paramedic of [place];Living will [place];Living will  Copy of [place] in Chart? No - copy requested Yes    Current Medications (verified) Outpatient Encounter Medications as of 05/16/2020  Medication Sig  . amLODipine (NORVASC) 5 MG tablet Take 1 tablet (5 mg total) by mouth daily.  Marland Kitchen aspirin 81 MG tablet Take 81 mg by mouth every Monday, Wednesday, and 02-Oct-2022.   . calcium carbonate (TUMS EX) 750 MG chewable tablet Chew by mouth.   . Cholecalciferol (VITAMIN D3) 25 MCG (1000 UT) CAPS Take 1 capsule by mouth daily.  . cycloSPORINE (RESTASIS) 0.05 % ophthalmic emulsion Place 1 drop into both eyes 2 (two) times daily.  . ferrous sulfate 325 (65 FE) MG tablet Take 325 mg by mouth daily.  . hydrochlorothiazide (MICROZIDE) 12.5 MG capsule  TAKE 1 CAPSULE(12.5 MG) BY MOUTH DAILY  . Magnesium 250 MG TABS Take 1 tablet by mouth daily.   . pravastatin (PRAVACHOL) 40 MG tablet TAKE 1 TABLET BY MOUTH EVERY DAY  . psyllium (METAMUCIL SMOOTH TEXTURE) 58.6 % powder Take 1 packet by mouth daily.  Marland Kitchen b complex vitamins capsule Take 1 capsule by mouth daily.   No facility-administered encounter medications on file as of 05/16/2020.    Allergies (verified) Patient has no known allergies.   History: Past Medical History:  Diagnosis Date  . Arthritis    hands, off meds  . Atrophic vaginitis 2014-10-02   by prior OBGYN  . History of migraine    resolved after menopause  . Hyperlipidemia   . Hypertension   . Osteopenia 09/2012   DEXA T-1.9 at spine, consider rpt in 3-5 yrs  . Situational depression 10-01-09   after brother died (lung cancer)  . Ventricular ectopy    benign, s/p cards eval   Past Surgical History:  Procedure Laterality Date  . COLONOSCOPY  12/2011   normal ([facility] in [place])  . DEXA  09/2012   T -1.9 at spine, -0.7 at hip  . TONSILLECTOMY  1966   Family History  Problem Relation Age of Onset  . Alcohol abuse Mother   . Arthritis Mother   . Hyperlipidemia Mother   . CAD Mother 59       MI  . Hypertension Mother   . Hyperlipidemia Father   . CAD Father 68       MI  . Alcohol abuse Brother   .  Cancer Brother        lung (smoker)  . CAD Brother 60       MI  . Hyperlipidemia Brother   . Hypertension Brother   . Mental illness Brother   . Diabetes Brother   . Hypertension Sister   . Arthritis Maternal Grandmother   . Stroke Neg Hx    Social History   Socioeconomic History  . Marital status: Married    Spouse name: Not on file  . Number of children: Not on file  . Years of education: Not on file  . Highest education level: Not on file  Occupational History  . Not on file  Tobacco Use  . Smoking status: Former Research scientist (life sciences)  . Smokeless tobacco: Never Used  Vaping Use  . Vaping Use: Never used   Substance and Sexual Activity  . Alcohol use: Yes    Alcohol/week: 7.0 - 14.0 standard drinks    Types: 7 - 14 Glasses of wine per week    Comment: 1-2 glasses of wine a day   . Drug use: No  . Sexual activity: Not on file  Other Topics Concern  . 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 Difficulty of Paying Living Expenses: Not hard at all  Food Insecurity: No Food Insecurity  . Worried About Charity fundraiser in the Last Year: Never true  . Ran Out of Food in the Last Year: Never true  Transportation Needs: No Transportation Needs  . Lack of Transportation (Medical): No  . Lack of Transportation (Non-Medical): No  Physical Activity: Sufficiently Active  . Days of Exercise per Week: 4 days  . Minutes of Exercise per Session: 60 min  Stress: No Stress Concern Present  . Feeling of Stress : Not at all  Social Connections:   . Frequency of Communication with Friends and Family: Not on file  . Frequency of Social Gatherings with Friends and Family: Not on file  . Attends Religious Services: Not on file  . Active Member of Clubs or Organizations: Not on file  . Attends Archivist Meetings: Not on file  . Marital Status: Not on file    Tobacco Counseling Counseling given: Not Answered   Clinical Intake:  Pre-visit preparation completed: Yes  Pain : No/denies pain     Nutritional Risks: None Diabetes: No  How often do you need to have someone help you when you read instructions, pamphlets, or other written materials from your doctor or pharmacy?: 1 - Never What is the last grade level you completed in school?: Masters  Diabetic: No Nutrition Risk  Assessment:  Has the patient had any N/V/D within the last 2 months?  No  Does the patient have any non-healing wounds?  No  Has the patient had any unintentional weight loss or weight gain?  No   Diabetes:  Is the patient diabetic?  No  If diabetic, was a CBG obtained today?  N/A Did the patient bring in their glucometer from home?  N/A How often do you monitor your CBG's? N/A.   Financial Strains and Diabetes Management:  Are you having  any financial strains with the device, your supplies or your medication? N/A.  Does the patient want to be seen by Chronic Care Management for management of their diabetes?  N/A Would the patient like to be referred to a Nutritionist or for Diabetic Management?  N/A   Interpreter Needed?: No  Information entered by :: CJohnson, LPN   Activities of Daily Living In your present state of health, do you have any difficulty performing the following activities: 05/16/2020  Hearing? Y  Comment has noticed some changes in her hearing  Vision? N  Difficulty concentrating or making decisions? N  Walking or climbing stairs? N  Dressing or bathing? N  Doing errands, shopping? N  Preparing Food and eating ? N  Using the Toilet? N  In the past six months, have you accidently leaked urine? N  Do you have problems with loss of bowel control? N  Managing your Medications? N  Managing your Finances? N  Housekeeping or managing your Housekeeping? N  Some recent data might be hidden    Patient Care Team: Ria Bush, MD as PCP - General (Family Medicine)  Indicate any recent Medical Services you may have received from other than [facility] providers in the past year (date may be approximate).     Assessment:   This is a routine wellness examination for [place].  Hearing/Vision screen  Hearing Screening   125Hz  250Hz  500Hz  1000Hz  2000Hz  3000Hz  4000Hz  6000Hz  8000Hz   Right ear:           Left ear:           Vision Screening Comments: Patient gets  annual eye exams   Dietary issues and exercise activities discussed: Current Exercise Habits: Structured exercise class, Type of exercise: yoga, Time (Minutes): 50, Frequency (Times/Week): 4, Weekly Exercise (Minutes/Week): 200, Intensity: Moderate, Exercise limited by: None identified  Goals    . Patient Stated     Starting 02/04/2018, I will continue to walk for 30 minutes daily and to participate in Silver Sneakers for 60 minutes 3 days per week.     . Patient Stated     05/16/2020, I will continue to do yoga at the [facility] 4 days a week for 50 minutes.       Depression Screen PHQ 2/9 Scores 05/16/2020 09/13/2019 04/11/2019 02/04/2018 10/22/2016  PHQ - 2 Score 0 0 2 0 0  PHQ- 9 Score 0 4 12 0 -    Fall Risk Fall Risk  05/16/2020 04/11/2019 02/04/2018 01/27/2018 10/22/2016  Falls in the past year? 0 0 No No No  Comment - - - Emmi Telephone Survey: data to providers prior to load -  Number falls in past yr: 0 0 - - -  Injury with Fall? 0 0 - - -  Risk for fall due to : Medication side effect - - - -  Follow up Falls evaluation completed;Falls prevention discussed - - - -    Any stairs in or around the home? Yes  If so, are there any without handrails? No  Home free of loose throw rugs in walkways, pet beds, electrical cords, etc? Yes  Adequate lighting in your home to reduce risk of falls? Yes   ASSISTIVE DEVICES UTILIZED TO PREVENT FALLS:  Life alert? No  Use of a cane, walker or w/c? No  Grab bars in the bathroom? No  Shower chair or bench in shower? No  Elevated toilet seat or a handicapped toilet? No   TIMED UP AND GO:  Was the test performed? N/A, telephone visit .   Cognitive Function: MMSE - Mini Mental State Exam 05/16/2020 02/04/2018  Orientation to time 5 5  Orientation to Place 5 5  Registration 3 3  Attention/ Calculation 5 0  Recall 3 3  Language- name 2 objects - 0  Language- repeat 1 1  Language- follow 3 step command - 3  Language- read & follow direction - 0   Write a sentence - 0  Copy design - 0  Total score - 20  Mini Cog  Mini-Cog screen was completed. Maximum score is 22. A value of 0 denotes this part of the MMSE was not completed or the patient failed this part of the Mini-Cog screening.       Immunizations Immunization History  Administered Date(s) Administered  . Influenza, High Dose Seasonal PF 04/16/2017, 05/15/2018, 02/26/2019, 05/06/2020  . Influenza,inj,Quad PF,6+ Mos 05/04/2014  . Influenza-Unspecified 06/07/2015  . PFIZER SARS-COV-2 Vaccination 07/19/2019, 08/09/2019, 04/06/2020  . Pneumococcal Conjugate-13 10/22/2016  . Pneumococcal Polysaccharide-23 02/04/2018  . Td 06/30/2008, 05/04/2019  . Zoster 06/30/2010  . Zoster Recombinat (Shingrix) 03/20/2018, 07/19/2018    TDAP status: Up to date Flu Vaccine status: Up to date Pneumococcal vaccine status: Up to date Covid-19 vaccine status: Completed vaccines  Qualifies for Shingles Vaccine? Yes   Zostavax completed Yes   Shingrix Completed?: Yes  Screening Tests Health Maintenance  Topic Date Due  . MAMMOGRAM  05/24/2020  . COLONOSCOPY  01/08/2022  . TETANUS/TDAP  05/03/2029  . INFLUENZA VACCINE  Completed  . DEXA SCAN  Completed  . COVID-19 Vaccine  Completed  . Hepatitis C Screening  Completed  . PNA vac Low Risk Adult  Completed    Health Maintenance  There are no preventive care reminders to display for this patient.  Colorectal cancer screening: Completed 01/09/2012. Repeat every 10 years Mammogram status: Completed 05/25/2019. Repeat every year Bone Density status: due, will discuss with provider at upcoming visit   Lung Cancer Screening: (Low Dose CT Chest recommended if Age 19-80 years, 30 pack-year currently smoking OR have quit w/in 15 years.) does not qualify.    Additional Screening:  Hepatitis C Screening: does qualify; Completed 10/16/2016  Vision Screening: Recommended annual ophthalmology exams for early detection of glaucoma and  other disorders of the eye. Is the patient up to date with their annual eye exam?  Yes  Who is the provider or what is the name of the office in which the patient attends annual eye exams? Dr. Katy Fitch  If pt is not established with a provider, would they like to be referred to a provider to establish care? No .   Dental Screening: Recommended annual dental exams for proper oral hygiene  Community Resource Referral / Chronic Care Management: CRR required this visit?  No   CCM required this visit?  No      Plan:     I have personally reviewed and noted the following in the patient's chart:   . Medical and social history . Use of alcohol, tobacco or illicit drugs  . Current medications and supplements . Functional ability and status . Nutritional status . Physical activity . Advanced directives . List of other physicians . Hospitalizations, surgeries, and ER visits in previous 12 months . Vitals . Screenings to include cognitive, depression, and falls . Referrals and appointments  In addition, I have reviewed and discussed with patient certain preventive protocols, quality metrics, and best practice recommendations. A written personalized care plan for  preventive services as well as general preventive health recommendations were provided to patient.   Due to this being a telephonic visit, the after visit summary with patients personalized plan was offered to patient via office or my-chart. Patient preferred to pick up at office at next visit or via mychart.   Andrez Grime, LPN   [id number]

## 2020-05-16 NOTE — Patient Instructions (Signed)
Sheri Patel , Thank you for taking time to come for your Medicare Wellness Visit. I appreciate your ongoing commitment to your health goals. Please review the following plan we discussed and let me know if I can assist you in the future.   Screening recommendations/referrals: Colonoscopy: Up to date, completed 01/09/2012, due 12/2021 Mammogram: Up to date, completed 05/25/2019, due 05/24/2020 Bone Density: due, will discuss with provider at office visit  Recommended yearly ophthalmology/optometry visit for glaucoma screening and checkup Recommended yearly dental visit for hygiene and checkup  Vaccinations: Influenza vaccine: Up to date, completed 05/06/2020, due 01/2021 Pneumococcal vaccine: Completed series Tdap vaccine: Up to date, completed 05/04/2019, due 04/2029 Shingles vaccine: Completed series  Covid-19:Completed series  Advanced directives: Please bring a copy of your POA (Power of [place]) and/or Living Will to your next appointment.  Conditions/risks identified: hypertension, hyperlipidemia  Next appointment: Follow up in one year for your annual wellness visit    Preventive Care 69 Years and Older, Female Preventive care refers to lifestyle choices and visits with your health care provider that can promote health and wellness. What does preventive care include?  A yearly physical exam. This is also called an annual well check.  Dental exams once or twice a year.  Routine eye exams. Ask your health care provider how often you should have your eyes checked.  Personal lifestyle choices, including:  Daily care of your teeth and gums.  Regular physical activity.  Eating a healthy diet.  Avoiding tobacco and drug use.  Limiting alcohol use.  Practicing safe sex.  Taking low-dose aspirin every day.  Taking vitamin and mineral supplements as recommended by your health care provider. What happens during an annual well check? The services and screenings done by your  health care provider during your annual well check will depend on your age, overall health, lifestyle risk factors, and family history of disease. Counseling  Your health care provider may ask you questions about your:  Alcohol use.  Tobacco use.  Drug use.  Emotional well-being.  Home and relationship well-being.  Sexual activity.  Eating habits.  History of falls.  Memory and ability to understand (cognition).  Work and work Statistician.  Reproductive health. Screening  You may have the following tests or measurements:  Height, weight, and BMI.  Blood pressure.  Lipid and cholesterol levels. These may be checked every 5 years, or more frequently if you are over 82 years old.  Skin check.  Lung cancer screening. You may have this screening every year starting at age 49 if you have a 30-pack-year history of smoking and currently smoke or have quit within the past 15 years.  Fecal occult blood test (FOBT) of the stool. You may have this test every year starting at age 65.  Flexible sigmoidoscopy or colonoscopy. You may have a sigmoidoscopy every 5 years or a colonoscopy every 10 years starting at age 25.  Hepatitis C blood test.  Hepatitis B blood test.  Sexually transmitted disease (STD) testing.  Diabetes screening. This is done by checking your blood sugar (glucose) after you have not eaten for a while (fasting). You may have this done every 1-3 years.  Bone density scan. This is done to screen for osteoporosis. You may have this done starting at age 23.  Mammogram. This may be done every 1-2 years. Talk to your health care provider about how often you should have regular mammograms. Talk with your health care provider about your test results, treatment options, and  if necessary, the need for more tests. Vaccines  Your health care provider may recommend certain vaccines, such as:  Influenza vaccine. This is recommended every year.  Tetanus, diphtheria, and  acellular pertussis (Tdap, Td) vaccine. You may need a Td booster every 10 years.  Zoster vaccine. You may need this after age 75.  Pneumococcal 13-valent conjugate (PCV13) vaccine. One dose is recommended after age 49.  Pneumococcal polysaccharide (PPSV23) vaccine. One dose is recommended after age 30. Talk to your health care provider about which screenings and vaccines you need and how often you need them. This information is not intended to replace advice given to you by your health care provider. Make sure you discuss any questions you have with your health care provider. Document Released: 07/13/2015 Document Revised: 03/05/2016 Document Reviewed: 04/17/2015 Elsevier Interactive Patient Education  2017 [place] Prevention in the Home Falls can cause injuries. They can happen to people of all ages. There are many things you can do to make your home safe and to help prevent falls. What can I do on the outside of my home?  Regularly fix the edges of walkways and driveways and fix any cracks.  Remove anything that might make you trip as you walk through a door, such as a raised step or threshold.  Trim any bushes or trees on the path to your home.  Use bright outdoor lighting.  Clear any walking paths of anything that might make someone trip, such as rocks or tools.  Regularly check to see if handrails are loose or broken. Make sure that both sides of any steps have handrails.  Any raised decks and porches should have guardrails on the edges.  Have any leaves, snow, or ice cleared regularly.  Use sand or salt on walking paths during winter.  Clean up any spills in your garage right away. This includes oil or grease spills. What can I do in the bathroom?  Use night lights.  Install grab bars by the toilet and in the tub and shower. Do not use towel bars as grab bars.  Use non-skid mats or decals in the tub or shower.  If you need to sit down in the shower, use  a plastic, non-slip stool.  Keep the floor dry. Clean up any water that spills on the floor as soon as it happens.  Remove soap buildup in the tub or shower regularly.  Attach bath mats securely with double-sided non-slip rug tape.  Do not have throw rugs and other things on the floor that can make you trip. What can I do in the bedroom?  Use night lights.  Make sure that you have a light by your bed that is easy to reach.  Do not use any sheets or blankets that are too big for your bed. They should not hang down onto the floor.  Have a firm chair that has side arms. You can use this for support while you get dressed.  Do not have throw rugs and other things on the floor that can make you trip. What can I do in the kitchen?  Clean up any spills right away.  Avoid walking on wet floors.  Keep items that you use a lot in easy-to-reach places.  If you need to reach something above you, use a strong step stool that has a grab bar.  Keep electrical cords out of the way.  Do not use floor polish or wax that makes floors slippery. If you  must use wax, use non-skid floor wax.  Do not have throw rugs and other things on the floor that can make you trip. What can I do with my stairs?  Do not leave any items on the stairs.  Make sure that there are handrails on both sides of the stairs and use them. Fix handrails that are broken or loose. Make sure that handrails are as long as the stairways.  Check any carpeting to make sure that it is firmly attached to the stairs. Fix any carpet that is loose or worn.  Avoid having throw rugs at the top or bottom of the stairs. If you do have throw rugs, attach them to the floor with carpet tape.  Make sure that you have a light switch at the top of the stairs and the bottom of the stairs. If you do not have them, ask someone to add them for you. What else can I do to help prevent falls?  Wear shoes that:  Do not have high heels.  Have  rubber bottoms.  Are comfortable and fit you well.  Are closed at the toe. Do not wear sandals.  If you use a stepladder:  Make sure that it is fully opened. Do not climb a closed stepladder.  Make sure that both sides of the stepladder are locked into place.  Ask someone to hold it for you, if possible.  Clearly mark and make sure that you can see:  Any grab bars or handrails.  First and last steps.  Where the edge of each step is.  Use tools that help you move around (mobility aids) if they are needed. These include:  Canes.  Walkers.  Scooters.  Crutches.  Turn on the lights when you go into a dark area. Replace any light bulbs as soon as they burn out.  Set up your furniture so you have a clear path. Avoid moving your furniture around.  If any of your floors are uneven, fix them.  If there are any pets around you, be aware of where they are.  Review your medicines with your doctor. Some medicines can make you feel dizzy. This can increase your chance of falling. Ask your doctor what other things that you can do to help prevent falls. This information is not intended to replace advice given to you by your health care provider. Make sure you discuss any questions you have with your health care provider. Document Released: 04/12/2009 Document Revised: 11/22/2015 Document Reviewed: 07/21/2014 Elsevier Interactive Patient Education  2017 [employer].

## 2020-05-19 LAB — VITAMIN B1: Vitamin B1 (Thiamine): 17 nmol/L (ref 8–30)

## 2020-05-23 ENCOUNTER — Ambulatory Visit (INDEPENDENT_AMBULATORY_CARE_PROVIDER_SITE_OTHER): Payer: Medicare Other | Admitting: Family Medicine

## 2020-05-23 ENCOUNTER — Encounter: Payer: Self-pay | Admitting: Family Medicine

## 2020-05-23 ENCOUNTER — Other Ambulatory Visit: Payer: Self-pay

## 2020-05-23 VITALS — BP 132/82 | HR 78 | Temp 98.0°F | Ht 66.0 in | Wt 142.3 lb

## 2020-05-23 DIAGNOSIS — I10 Essential (primary) hypertension: Secondary | ICD-10-CM | POA: Diagnosis not present

## 2020-05-23 DIAGNOSIS — M8580 Other specified disorders of bone density and structure, unspecified site: Secondary | ICD-10-CM

## 2020-05-23 DIAGNOSIS — Z8742 Personal history of other diseases of the female genital tract: Secondary | ICD-10-CM | POA: Diagnosis not present

## 2020-05-23 DIAGNOSIS — I358 Other nonrheumatic aortic valve disorders: Secondary | ICD-10-CM

## 2020-05-23 DIAGNOSIS — E538 Deficiency of other specified B group vitamins: Secondary | ICD-10-CM

## 2020-05-23 DIAGNOSIS — E785 Hyperlipidemia, unspecified: Secondary | ICD-10-CM | POA: Diagnosis not present

## 2020-05-23 MED ORDER — AMLODIPINE BESYLATE 5 MG PO TABS: 5.0000 mg | ORAL_TABLET | Freq: Every day | ORAL | 3 refills | Status: DC

## 2020-05-23 MED ORDER — B-12 1000 MCG SL SUBL: 1.0000 | SUBLINGUAL_TABLET | Freq: Every day | SUBLINGUAL | Status: DC

## 2020-05-23 MED ORDER — HYDROCHLOROTHIAZIDE 12.5 MG PO CAPS: 12.5000 mg | ORAL_CAPSULE | Freq: Every day | ORAL | 3 refills | Status: DC

## 2020-05-23 MED ORDER — ASPIRIN EC 81 MG PO TBEC: 81.0000 mg | DELAYED_RELEASE_TABLET | ORAL | Status: AC

## 2020-05-23 MED ORDER — PRAVASTATIN SODIUM 40 MG PO TABS: 40.0000 mg | ORAL_TABLET | Freq: Every day | ORAL | 3 refills | Status: DC

## 2020-05-23 NOTE — Assessment & Plan Note (Addendum)
Chronic, stable, continue current regimen.  Advised to check BP both before and after exercise routine.

## 2020-05-23 NOTE — Assessment & Plan Note (Signed)
Chronic, stable on pravastatin - continue. The 10-year ASCVD risk score Mikey Bussing DC Brooke Bonito., et al., 2013) is: 11.6%   Values used to calculate the score:     Age: 69 years     Sex: Female     Is Non-Hispanic African American: No     Diabetic: No     Tobacco smoker: No     Systolic Blood Pressure: 499 mmHg     Is BP treated: Yes     HDL Cholesterol: 51.2 mg/dL     Total Cholesterol: 169 mg/dL

## 2020-05-23 NOTE — Progress Notes (Signed)
Patient ID: Sheri Patel, female    DOB: 1950/08/27, 69 y.o.   MRN: [id number]  This visit was conducted in person.  BP 132/82 (BP Location: Left Arm, Patient Position: Sitting, Cuff Size: Normal)   Pulse 78   Temp 98 F (36.7 C) (Temporal)   Ht 5\' 6"  (1.676 m)   Wt 142 lb 5 oz (64.6 kg)   SpO2 97%   BMI 22.97 kg/m   BP Readings from Last 3 Encounters:  05/23/20 132/82  09/13/19 132/78  04/11/19 (!) 152/98  152/90 on repeat today   CC: CPE Subjective:   HPI: Sheri Patel is a 69 y.o. female presenting on 05/23/2020 for Annual Exam (Prt 2. )   Saw health advisor last week for medicare wellness visit. Note reviewed.    Hearing Screening   125Hz  250Hz  500Hz  1000Hz  2000Hz  3000Hz  4000Hz  6000Hz  8000Hz   Right ear:   20 20 20  25     Left ear:   20 20 20   40        Clinical Support from 05/16/2020 in [place] at [facility] Total Score 0    Notes more difficulty in noisy room.  Fall Risk  05/16/2020 04/11/2019 02/04/2018 01/27/2018 10/22/2016  Falls in the past year? 0 0 No No No  Comment - - - Emmi Telephone Survey: data to providers prior to load -  Number falls in past yr: 0 0 - - -  Injury with Fall? 0 0 - - -  Risk for fall due to : Medication side effect - - - -  Follow up Falls evaluation completed;Falls prevention discussed - - - -    Notes BP dropping to 99/60 after aerobic exercise. Denies dizziness or other hypotensive symptoms.  Preventative: COLONOSCOPY Date: 12/2011 normal ([facility] in [place]) Well woman examat our office 08/2015 and 03/2019 WNL. Will discontinue cervical cancer screening.  Mammogram 05/2019 - upcoming rpt next month. Self breast exams at homewithout concerns.G3P2  Lung cancer screening - not eligible DEXA Date: 09/2012 T -1.9 at spine, -0.7 at hip, was on fosamax for 5 yrs, stopped 10/2013.DEXA 03/2018 T -1.3 spine. Flu shot - yearly  [place] 07/2019, 08/2019, 03/2020 Td 2010, updated at  pharmacy 05/2019 Prevnar4/2018, pneumovax 01/2018 Zostavax - 2012 Shingrix 02/2018, 06/2018 Advanced planning: In chart - HCPOA is husband Richard then [employer]. would not want prolonged life support  (10/2014) Seat belt use discussed Sunscreen use discussed. No changing moles on skin. Has seen derm. 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 Interim medical history since our last visit reviewed. Allergies and medications reviewed and updated. Outpatient Medications Prior to Visit  Medication Sig Dispense Refill  . calcium carbonate (TUMS EX) 750 MG chewable tablet Chew by mouth.     . Cholecalciferol (VITAMIN D3) 25 MCG (1000 UT) CAPS Take 1 capsule by mouth daily.    . cycloSPORINE (RESTASIS) 0.05 % ophthalmic emulsion Place 1 drop into both eyes 2 (two) times daily.    . ferrous sulfate 325 (65 FE) MG tablet  Take 325 mg by mouth daily.    . Magnesium 250 MG TABS Take 1 tablet by mouth daily.     . psyllium (METAMUCIL SMOOTH TEXTURE) 58.6 % powder Take 1 packet by mouth daily.    Marland Kitchen amLODipine (NORVASC) 5 MG tablet Take 1 tablet (5 mg total) by mouth daily. 90 tablet 1  . aspirin 81 MG tablet Take 81 mg by mouth every Monday, Wednesday, and Friday.     . hydrochlorothiazide (MICROZIDE) 12.5 MG capsule TAKE 1 CAPSULE(12.5 MG) BY MOUTH DAILY 90 capsule 0  . pravastatin (PRAVACHOL) 40 MG tablet TAKE 1 TABLET BY MOUTH EVERY DAY 90 tablet 0  . b complex vitamins capsule Take 1 capsule by mouth daily.     No facility-administered medications prior to  visit.     Per HPI unless specifically indicated in ROS section below Review of Systems Objective:  BP 132/82 (BP Location: Left Arm, Patient Position: Sitting, Cuff Size: Normal)   Pulse 78   Temp 98 F (36.7 C) (Temporal)   Ht 5\' 6"  (1.676 m)   Wt 142 lb 5 oz (64.6 kg)   SpO2 97%   BMI 22.97 kg/m   Wt Readings from Last 3 Encounters:  05/23/20 142 lb 5 oz (64.6 kg)  09/13/19 148 lb 7 oz (67.3 kg)  04/11/19 150 lb 1 oz (68.1 kg)      Physical Exam Vitals and nursing note reviewed.  Constitutional:      General: She is not in acute distress.    Appearance: Normal appearance. She is well-developed. She is not ill-appearing.  HENT:     Head: Normocephalic and atraumatic.     Right Ear: Hearing, tympanic membrane, ear canal and external ear normal.     Left Ear: Hearing, tympanic membrane, ear canal and external ear normal.     Mouth/Throat:     Pharynx: Uvula midline.  Eyes:     General: No scleral icterus.    Extraocular Movements: Extraocular movements intact.     Conjunctiva/sclera: Conjunctivae normal.     Pupils: Pupils are equal, round, and reactive to light.  Cardiovascular:     Rate and Rhythm: Normal rate and regular rhythm.     Pulses: Normal pulses.          Radial pulses are 2+ on the right side and 2+ on the left side.     Heart sounds: Normal heart sounds. No murmur heard.   Pulmonary:     Effort: Pulmonary effort is normal. No respiratory distress.     Breath sounds: Normal breath sounds. No wheezing, rhonchi or rales.  Abdominal:     General: Abdomen is flat. Bowel sounds are normal. There is no distension.     Palpations: Abdomen is soft. There is no mass.     Tenderness: There is no abdominal tenderness. There is no guarding or rebound.     Hernia: No hernia is present.  Musculoskeletal:        General: Normal range of motion.     Cervical back: Normal range of motion and neck supple.     Right lower leg: No edema.     Left lower leg: No edema.    Lymphadenopathy:     Cervical: No cervical adenopathy.  Skin:    General: Skin is warm and dry.     Findings: No rash.  Neurological:     General: No focal deficit present.     Mental Status: She is alert and oriented  to person, place, and time.     Comments: CN grossly intact, station and gait intact  Psychiatric:        Mood and Affect: Mood normal.        Behavior: Behavior normal.        Thought Content: Thought content normal.        Judgment: Judgment normal.       Results for orders placed or performed in visit on 05/16/20  Vitamin B12  Result Value Ref Range   Vitamin B-12 264 211 - 911 pg/mL   Assessment & Plan:  This visit occurred during the SARS-CoV-2 public health emergency.  Safety protocols were in place, including screening questions prior to the visit, additional usage of staff PPE, and extensive cleaning of exam room while observing appropriate contact time as indicated for disinfecting solutions.   Problem List Items Addressed This Visit    Osteopenia    Continue regular walking. Continue calcium tums chewable daily       Low serum vitamin B12    rec start oral b12 1039mcg daily (dissolvable tablets)      Hypertension - Primary    Chronic, stable, continue current regimen.  Advised to check BP both before and after exercise routine.       Relevant Medications   amLODipine (NORVASC) 5 MG tablet   pravastatin (PRAVACHOL) 40 MG tablet   hydrochlorothiazide (MICROZIDE) 12.5 MG capsule   aspirin EC 81 MG tablet   Hyperlipidemia    Chronic, stable on pravastatin - continue. The 10-year ASCVD risk score Mikey Bussing DC Brooke Bonito., et al., 2013) is: 11.6%   Values used to calculate the score:     Age: 37 years     Sex: Female     Is Non-Hispanic African American: No     Diabetic: No     Tobacco smoker: No     Systolic Blood Pressure: 213 mmHg     Is BP treated: Yes     HDL Cholesterol: 51.2 mg/dL     Total Cholesterol: 169 mg/dL       Relevant Medications    amLODipine (NORVASC) 5 MG tablet   pravastatin (PRAVACHOL) 40 MG tablet   hydrochlorothiazide (MICROZIDE) 12.5 MG capsule   aspirin EC 81 MG tablet   History of abnormal cervical Pap smear    Normal 2017, 2020. Will stop screening.       Aortic valve sclerosis    Murmur again heard today.       Relevant Medications   amLODipine (NORVASC) 5 MG tablet   pravastatin (PRAVACHOL) 40 MG tablet   hydrochlorothiazide (MICROZIDE) 12.5 MG capsule   aspirin EC 81 MG tablet       Meds ordered this encounter  Medications  . amLODipine (NORVASC) 5 MG tablet    Sig: Take 1 tablet (5 mg total) by mouth daily.    Dispense:  90 tablet    Refill:  3  . Cyanocobalamin (B-12) 1000 MCG SUBL    Sig: Place 1 tablet under the tongue daily.  . pravastatin (PRAVACHOL) 40 MG tablet    Sig: Take 1 tablet (40 mg total) by mouth daily.    Dispense:  90 tablet    Refill:  3  . hydrochlorothiazide (MICROZIDE) 12.5 MG capsule    Sig: Take 1 capsule (12.5 mg total) by mouth daily.    Dispense:  90 capsule    Refill:  3  . aspirin EC 81 MG tablet    Sig: Take  1 tablet (81 mg total) by mouth once a week. Swallow whole.   No orders of the defined types were placed in this encounter.   Patient instructions: Ok to drop aspirin to once weekly dosing.  continue current medicines You are doing well today Return as needed or in 1 year for next wellness visit.   Follow up plan: Return in about 1 year (around 05/23/2021) for medicare wellness visit.  Ria Bush, MD

## 2020-05-23 NOTE — Assessment & Plan Note (Signed)
Continue regular walking. Continue calcium tums chewable daily

## 2020-05-23 NOTE — Assessment & Plan Note (Deleted)
Advanced planning: In chart - HCPOA is husband Sheri Patel then [place]. Would not want prolonged life support (10/2014)

## 2020-05-23 NOTE — Assessment & Plan Note (Signed)
Murmur again heard today.

## 2020-05-23 NOTE — Patient Instructions (Addendum)
Ok to drop aspirin to once weekly dosing.  continue current medicines You are doing well today Return as needed or in 1 year for next wellness visit.   Health Maintenance After Age 69 After age 89, you are at a higher risk for certain long-term diseases and infections as well as injuries from falls. Falls are a major cause of broken bones and head injuries in people who are older than age 67. Getting regular preventive care can help to keep you healthy and well. Preventive care includes getting regular testing and making lifestyle changes as recommended by your health care provider. Talk with your health care provider about:  Which screenings and tests you should have. A screening is a test that checks for a disease when you have no symptoms.  A diet and exercise plan that is right for you. What should I know about screenings and tests to prevent falls? Screening and testing are the best ways to find a health problem early. Early diagnosis and treatment give you the best chance of managing medical conditions that are common after age 40. Certain conditions and lifestyle choices may make you more likely to have a fall. Your health care provider may recommend:  Regular vision checks. Poor vision and conditions such as cataracts can make you more likely to have a fall. If you wear glasses, make sure to get your prescription updated if your vision changes.  Medicine review. Work with your health care provider to regularly review all of the medicines you are taking, including over-the-counter medicines. Ask your health care provider about any side effects that may make you more likely to have a fall. Tell your health care provider if any medicines that you take make you feel dizzy or sleepy.  Osteoporosis screening. Osteoporosis is a condition that causes the bones to get weaker. This can make the bones weak and cause them to break more easily.  Blood pressure screening. Blood pressure changes and  medicines to control blood pressure can make you feel dizzy.  Strength and balance checks. Your health care provider may recommend certain tests to check your strength and balance while standing, walking, or changing positions.  Foot health exam. Foot pain and numbness, as well as not wearing proper footwear, can make you more likely to have a fall.  Depression screening. You may be more likely to have a fall if you have a fear of falling, feel emotionally low, or feel unable to do activities that you used to do.  Alcohol use screening. Using too much alcohol can affect your balance and may make you more likely to have a fall. What actions can I take to lower my risk of falls? General instructions  Talk with your health care provider about your risks for falling. Tell your health care provider if: ? You fall. Be sure to tell your health care provider about all falls, even ones that seem minor. ? You feel dizzy, sleepy, or off-balance.  Take over-the-counter and prescription medicines only as told by your health care provider. These include any supplements.  Eat a healthy diet and maintain a healthy weight. A healthy diet includes low-fat dairy products, low-fat (lean) meats, and fiber from whole grains, beans, and lots of fruits and vegetables. Home safety  Remove any tripping hazards, such as rugs, cords, and clutter.  Install safety equipment such as grab bars in bathrooms and safety rails on stairs.  Keep rooms and walkways well-lit. Activity   Follow a regular exercise program  to stay fit. This will help you maintain your balance. Ask your health care provider what types of exercise are appropriate for you.  If you need a cane or walker, use it as recommended by your health care provider.  Wear supportive shoes that have nonskid soles. Lifestyle  Do not drink alcohol if your health care provider tells you not to drink.  If you drink alcohol, limit how much you have: ? 0-1  drink a day for women. ? 0-2 drinks a day for men.  Be aware of how much alcohol is in your drink. In the [place] one drink equals one typical bottle of beer (12 oz), one-half glass of wine (5 oz), or one shot of hard liquor (1 oz).  Do not use any products that contain nicotine or tobacco, such as cigarettes and e-cigarettes. If you need help quitting, ask your health care provider. Summary  Having a healthy lifestyle and getting preventive care can help to protect your health and wellness after age 60.  Screening and testing are the best way to find a health problem early and help you avoid having a fall. Early diagnosis and treatment give you the best chance for managing medical conditions that are more common for people who are older than age 42.  Falls are a major cause of broken bones and head injuries in people who are older than age 78. Take precautions to prevent a fall at home.  Work with your health care provider to learn what changes you can make to improve your health and wellness and to prevent falls. This information is not intended to replace advice given to you by your health care provider. Make sure you discuss any questions you have with your health care provider. Document Revised: 10/07/2018 Document Reviewed: 04/29/2017 Elsevier Patient Education  2020 [employer].

## 2020-05-23 NOTE — Assessment & Plan Note (Signed)
Normal 2017, 2020. Will stop screening.

## 2020-05-23 NOTE — Assessment & Plan Note (Addendum)
rec start oral b12 1029mcg daily (dissolvable tablets)

## 2020-05-28 ENCOUNTER — Other Ambulatory Visit: Payer: Self-pay | Admitting: Family Medicine

## 2020-05-28 DIAGNOSIS — Z1231 Encounter for screening mammogram for malignant neoplasm of breast: Secondary | ICD-10-CM

## 2020-05-29 ENCOUNTER — Other Ambulatory Visit: Payer: Self-pay | Admitting: Family Medicine

## 2020-05-31 ENCOUNTER — Other Ambulatory Visit: Payer: Self-pay

## 2020-05-31 ENCOUNTER — Ambulatory Visit
Admission: RE | Admit: 2020-05-31 | Discharge: 2020-05-31 | Disposition: A | Discharge status: Home / Self Care | Payer: Medicare Other | Source: Ambulatory Visit

## 2020-05-31 DIAGNOSIS — Z1231 Encounter for screening mammogram for malignant neoplasm of breast: Secondary | ICD-10-CM

## 2020-10-07 ENCOUNTER — Encounter: Payer: Self-pay | Admitting: Family Medicine

## 2020-10-08 NOTE — Telephone Encounter (Signed)
Updated pt's chart.  

## 2021-03-12 DIAGNOSIS — H18593 Other hereditary corneal dystrophies, bilateral: Secondary | ICD-10-CM | POA: Diagnosis not present

## 2021-03-12 DIAGNOSIS — H2513 Age-related nuclear cataract, bilateral: Secondary | ICD-10-CM | POA: Diagnosis not present

## 2021-03-12 DIAGNOSIS — Z9889 Other specified postprocedural states: Secondary | ICD-10-CM | POA: Diagnosis not present

## 2021-03-12 DIAGNOSIS — H43813 Vitreous degeneration, bilateral: Secondary | ICD-10-CM | POA: Diagnosis not present

## 2021-03-12 DIAGNOSIS — D3132 Benign neoplasm of left choroid: Secondary | ICD-10-CM | POA: Diagnosis not present

## 2021-03-12 DIAGNOSIS — H16223 Keratoconjunctivitis sicca, not specified as Sjogren's, bilateral: Secondary | ICD-10-CM | POA: Diagnosis not present

## 2021-04-22 ENCOUNTER — Other Ambulatory Visit: Payer: Self-pay | Admitting: Family Medicine

## 2021-04-22 DIAGNOSIS — Z1231 Encounter for screening mammogram for malignant neoplasm of breast: Secondary | ICD-10-CM

## 2021-05-18 ENCOUNTER — Other Ambulatory Visit: Payer: Self-pay | Admitting: Family Medicine

## 2021-05-18 DIAGNOSIS — E538 Deficiency of other specified B group vitamins: Secondary | ICD-10-CM

## 2021-05-18 DIAGNOSIS — E785 Hyperlipidemia, unspecified: Secondary | ICD-10-CM

## 2021-05-18 DIAGNOSIS — F1029 Alcohol dependence with unspecified alcohol-induced disorder: Secondary | ICD-10-CM

## 2021-05-18 DIAGNOSIS — I10 Essential (primary) hypertension: Secondary | ICD-10-CM

## 2021-05-21 ENCOUNTER — Other Ambulatory Visit: Payer: Medicare Other

## 2021-05-21 ENCOUNTER — Other Ambulatory Visit (INDEPENDENT_AMBULATORY_CARE_PROVIDER_SITE_OTHER): Payer: Medicare Other

## 2021-05-21 ENCOUNTER — Encounter: Payer: Self-pay | Admitting: Family Medicine

## 2021-05-21 ENCOUNTER — Other Ambulatory Visit: Payer: Self-pay

## 2021-05-21 DIAGNOSIS — F1029 Alcohol dependence with unspecified alcohol-induced disorder: Secondary | ICD-10-CM | POA: Diagnosis not present

## 2021-05-21 DIAGNOSIS — E785 Hyperlipidemia, unspecified: Secondary | ICD-10-CM | POA: Diagnosis not present

## 2021-05-21 DIAGNOSIS — E538 Deficiency of other specified B group vitamins: Secondary | ICD-10-CM

## 2021-05-21 DIAGNOSIS — I10 Essential (primary) hypertension: Secondary | ICD-10-CM | POA: Diagnosis not present

## 2021-05-21 NOTE — Telephone Encounter (Signed)
Updated pt's chart.  

## 2021-05-22 LAB — COMPREHENSIVE METABOLIC PANEL
ALT: 22 U/L (ref 0–35)
AST: 16 U/L (ref 0–37)
Albumin: 4.5 g/dL (ref 3.5–5.2)
Alkaline Phosphatase: 53 U/L (ref 39–117)
BUN: 16 mg/dL (ref 6–23)
CO2: 30 mEq/L (ref 19–32)
Calcium: 9.5 mg/dL (ref 8.4–10.5)
Chloride: 97 mEq/L (ref 96–112)
Creatinine, Ser: 0.76 mg/dL (ref 0.40–1.20)
GFR: 79.45 mL/min (ref >60.00)
Glucose, Bld: 91 mg/dL (ref 70–99)
Potassium: 4.1 mEq/L (ref 3.5–5.1)
Sodium: 135 mEq/L (ref 135–145)
Total Bilirubin: 0.5 mg/dL (ref 0.2–1.2)
Total Protein: 6.9 g/dL (ref 6.0–8.3)

## 2021-05-22 LAB — CBC WITH DIFFERENTIAL/PLATELET
Basophils Absolute: 0 10*3/uL (ref 0.0–0.1)
Basophils Relative: 0.7 % (ref 0.0–3.0)
Eosinophils Absolute: 0.1 10*3/uL (ref 0.0–0.7)
Eosinophils Relative: 2.1 % (ref 0.0–5.0)
HCT: 39.6 % (ref 36.0–46.0)
Hemoglobin: 13.3 g/dL (ref 12.0–15.0)
Lymphocytes Relative: 27.4 % (ref 12.0–46.0)
Lymphs Abs: 1.2 10*3/uL (ref 0.7–4.0)
MCHC: 33.7 g/dL (ref 30.0–36.0)
MCV: 87.3 fl (ref 78.0–100.0)
Monocytes Absolute: 0.6 10*3/uL (ref 0.1–1.0)
Monocytes Relative: 12.6 % — ABNORMAL HIGH (ref 3.0–12.0)
Neutro Abs: 2.5 10*3/uL (ref 1.4–7.7)
Neutrophils Relative %: 57.2 % (ref 43.0–77.0)
Platelets: 290 10*3/uL (ref 150.0–400.0)
RBC: 4.54 Mil/uL (ref 3.87–5.11)
RDW: 13.1 % (ref 11.5–15.5)
WBC: 4.5 10*3/uL (ref 4.0–10.5)

## 2021-05-22 LAB — VITAMIN B12: Vitamin B-12: 953 pg/mL — ABNORMAL HIGH (ref 211–911)

## 2021-05-22 LAB — LIPID PANEL
Cholesterol: 157 mg/dL (ref 0–200)
HDL: 35.4 mg/dL — ABNORMAL LOW (ref >39.00)
NonHDL: 121.94
Total CHOL/HDL Ratio: 4
Triglycerides: 237 mg/dL — ABNORMAL HIGH (ref 0.0–149.0)
VLDL: 47.4 mg/dL — ABNORMAL HIGH (ref 0.0–40.0)

## 2021-05-22 LAB — MICROALBUMIN / CREATININE URINE RATIO
Creatinine,U: 38.8 mg/dL
Microalb Creat Ratio: 1.8 mg/g (ref 0.0–30.0)
Microalb, Ur: <0.7 mg/dL (ref 0.0–1.9)

## 2021-05-22 LAB — LDL CHOLESTEROL, DIRECT: Direct LDL: 88 mg/dL

## 2021-05-28 ENCOUNTER — Encounter: Payer: Self-pay | Admitting: Family Medicine

## 2021-05-28 ENCOUNTER — Ambulatory Visit (INDEPENDENT_AMBULATORY_CARE_PROVIDER_SITE_OTHER): Payer: Medicare Other | Admitting: Family Medicine

## 2021-05-28 ENCOUNTER — Other Ambulatory Visit: Payer: Self-pay

## 2021-05-28 ENCOUNTER — Ambulatory Visit: Payer: Medicare Other | Admitting: Family Medicine

## 2021-05-28 VITALS — BP 120/70 | HR 79 | Temp 97.9°F | Ht 66.0 in | Wt 139.3 lb

## 2021-05-28 DIAGNOSIS — F1029 Alcohol dependence with unspecified alcohol-induced disorder: Secondary | ICD-10-CM

## 2021-05-28 DIAGNOSIS — Z0000 Encounter for general adult medical examination without abnormal findings: Secondary | ICD-10-CM | POA: Diagnosis not present

## 2021-05-28 DIAGNOSIS — E538 Deficiency of other specified B group vitamins: Secondary | ICD-10-CM

## 2021-05-28 DIAGNOSIS — I350 Nonrheumatic aortic (valve) stenosis: Secondary | ICD-10-CM

## 2021-05-28 DIAGNOSIS — Z7189 Other specified counseling: Secondary | ICD-10-CM

## 2021-05-28 DIAGNOSIS — E782 Mixed hyperlipidemia: Secondary | ICD-10-CM | POA: Diagnosis not present

## 2021-05-28 DIAGNOSIS — I10 Essential (primary) hypertension: Secondary | ICD-10-CM

## 2021-05-28 DIAGNOSIS — M8588 Other specified disorders of bone density and structure, other site: Secondary | ICD-10-CM

## 2021-05-28 MED ORDER — PRAVASTATIN SODIUM 40 MG PO TABS: 40.0000 mg | ORAL_TABLET | Freq: Every day | ORAL | 3 refills | Status: DC

## 2021-05-28 MED ORDER — B-12 1000 MCG SL SUBL: 1.0000 | SUBLINGUAL_TABLET | SUBLINGUAL | Status: AC

## 2021-05-28 MED ORDER — AMLODIPINE BESYLATE 5 MG PO TABS: 5.0000 mg | ORAL_TABLET | Freq: Every day | ORAL | 3 refills | Status: DC

## 2021-05-28 MED ORDER — HYDROCHLOROTHIAZIDE 12.5 MG PO CAPS: 12.5000 mg | ORAL_CAPSULE | Freq: Every day | ORAL | 3 refills | Status: DC

## 2021-05-28 NOTE — Assessment & Plan Note (Signed)
Murmur stays stable, pt remains asymptomatic. Will continue to monitor.

## 2021-05-28 NOTE — Assessment & Plan Note (Signed)
Levels now high - will drop dose to MWF

## 2021-05-28 NOTE — Progress Notes (Signed)
Patient ID: Sheri Patel, female    DOB: 1950/12/30, 70 y.o.   MRN: [id number]  This visit was conducted in person.  BP 120/70   Pulse 79   Temp 97.9 F (36.6 C) (Temporal)   Ht 5\' 6"  (1.676 m)   Wt 139 lb 5 oz (63.2 kg)   SpO2 97%   BMI 22.49 kg/m    CC: AMW Subjective:   HPI: MARVELLA Patel is a 70 y.o. female presenting on 05/28/2021 for Medicare Wellness   Did not see health advisor this year.   Hearing Screening   500Hz  1000Hz  2000Hz  4000Hz   Right ear 20 20 25  40  Left ear 40 20 20 40  Vision Screening - Comments:: Last eye exam, 02/2021.  [facility] Office Visit from 05/28/2021 in [place] at [facility]  PHQ-2 Total Score 0       Fall Risk  05/28/2021 05/16/2020 04/11/2019 02/04/2018 01/27/2018  Falls in the past year? 0 0 0 No No  Comment - - - - Emmi Telephone Survey: data to providers prior to load  Number falls in past yr: - 0 0 - -  Injury with Fall? - 0 0 - -  Risk for fall due to : - Medication side effect - - -  Follow up - Falls evaluation completed;Falls prevention discussed - - -   Was looking into organ donation - ruled out due to mild aortic stenosis (on echo 03/2021).   COVID infection 01/2021, symptoms fully resolved.  Has lost weight.   Preventative: COLONOSCOPY Date: 12/2011 normal ([facility] in [place]) Well woman exam at our office 08/2015 and 03/2019 WNL. Will discontinue cervical cancer screening.  Mammogram 05/2020 [facility] @ breast center. G3P2  DEXA Date: 09/2012 T -1.9 at spine, -0.7 at hip, was on fosamax for 5 yrs, stopped 10/2013. DEXA 03/2018 T -1.3 spine.  Lung cancer screening - not eligible Flu shot - yearly  [place] 07/2019, 08/2019, booster 03/2020, 09/2020, bivalent booster 04/2021 Td 2010, updated at pharmacy 05/2019 Prevnar-13 09/2016, pneumovax 01/2018 Zostavax - 2012  Shingrix 02/2018, 06/2018  Advanced planning: In chart - HCPOA is husband Richard then [place]. Would not want prolonged  life support if terminal condition (10/2014).  Seat belt use discussed Sunscreen use discussed. No changing moles on skin. Sees derm. Ex smoker - remote  Alcohol - drinking <2 glasses of wine daily. Previously underwent treatment for alcohol use disorder [facility].  Sleep - averaging 6 hours/night. Has tried melatonin and ambien.  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 Interim medical history since our last visit reviewed. Allergies and medications reviewed and updated. Outpatient Medications Prior to Visit  Medication Sig Dispense Refill   aspirin EC 81 MG tablet Take 1 tablet (81 mg total) by mouth once a week. Swallow whole.     cycloSPORINE (RESTASIS) 0.05 % ophthalmic emulsion Place 1 drop into both eyes 2 (two) times daily.     psyllium (METAMUCIL SMOOTH TEXTURE) 58.6 % powder Take 1 packet by mouth daily.     amLODipine (NORVASC) 5 MG tablet Take 1 tablet (  5 mg total) by mouth daily. 90 tablet 3   Cholecalciferol (VITAMIN D3) 25 MCG (1000 UT) CAPS Take 1 capsule by mouth daily.     Cyanocobalamin (B-12) 1000 MCG SUBL Place 1 tablet under the tongue daily.     hydrochlorothiazide (MICROZIDE) 12.5 MG capsule Take 1 capsule (12.5 mg total) by mouth daily. 90 capsule 3   pravastatin (PRAVACHOL) 40 MG tablet Take 1 tablet (40 mg total) by mouth daily. 90 tablet 3   calcium carbonate (TUMS EX) 750 MG chewable tablet Chew by mouth.      ferrous sulfate 325 (65 FE) MG tablet Take 325 mg by mouth daily.     Magnesium 250 MG TABS Take 1 tablet by mouth daily.      No facility-administered  medications prior to visit.     Per HPI unless specifically indicated in ROS section below Review of Systems  Objective:  BP 120/70   Pulse 79   Temp 97.9 F (36.6 C) (Temporal)   Ht 5\' 6"  (1.676 m)   Wt 139 lb 5 oz (63.2 kg)   SpO2 97%   BMI 22.49 kg/m   Wt Readings from Last 3 Encounters:  05/28/21 139 lb 5 oz (63.2 kg)  05/23/20 142 lb 5 oz (64.6 kg)  09/13/19 148 lb 7 oz (67.3 kg)      Physical Exam Vitals and nursing note reviewed.  Constitutional:      Appearance: Normal appearance. She is not ill-appearing.  HENT:     Head: Normocephalic and atraumatic.     Right Ear: Tympanic membrane, ear canal and external ear normal. There is no impacted cerumen.     Left Ear: Tympanic membrane, ear canal and external ear normal. There is no impacted cerumen.  Eyes:     General:        Right eye: No discharge.        Left eye: No discharge.     Extraocular Movements: Extraocular movements intact.     Conjunctiva/sclera: Conjunctivae normal.     Pupils: Pupils are equal, round, and reactive to light.  Neck:     Thyroid: No thyroid mass or thyromegaly.     Vascular: Carotid bruit (referred from AS) present.  Cardiovascular:     Rate and Rhythm: Normal rate and regular rhythm.     Pulses: Normal pulses.     Heart sounds: Murmur (3/6 systolic RUSB) heard.  Pulmonary:     Effort: Pulmonary effort is normal. No respiratory distress.     Breath sounds: Normal breath sounds. No wheezing, rhonchi or rales.  Abdominal:     General: Bowel sounds are normal. There is no distension.     Palpations: Abdomen is soft. There is no mass.     Tenderness: There is no abdominal tenderness. There is no guarding or rebound.     Hernia: No hernia is present.  Musculoskeletal:     Cervical back: Normal range of motion and neck supple. No rigidity.     Right lower leg: No edema.     Left lower leg: No edema.  Lymphadenopathy:     Cervical: No cervical adenopathy.  Skin:    General: Skin is  warm and dry.     Findings: No rash.  Neurological:     General: No focal deficit present.     Mental Status: She is alert. Mental status is at baseline.     Comments:  Recall 3/3 Calculation 5/5 DLROW  Psychiatric:  Mood and Affect: Mood normal.        Behavior: Behavior normal.      Results for orders placed or performed in visit on 05/21/21  Microalbumin / creatinine urine ratio  Result Value Ref Range   Microalb, Ur <0.7 0.0 - 1.9 mg/dL   Creatinine,U 38.8 mg/dL   Microalb Creat Ratio 1.8 0.0 - 30.0 mg/g  Vitamin B12  Result Value Ref Range   Vitamin B-12 953 (H) 211 - 911 pg/mL  CBC with Differential/Platelet  Result Value Ref Range   WBC 4.5 4.0 - 10.5 K/uL   RBC 4.54 3.87 - 5.11 Mil/uL   Hemoglobin 13.3 12.0 - 15.0 g/dL   HCT 39.6 36.0 - 46.0 %   MCV 87.3 78.0 - 100.0 fl   MCHC 33.7 30.0 - 36.0 g/dL   RDW 13.1 11.5 - 15.5 %   Platelets 290.0 150.0 - 400.0 K/uL   Neutrophils Relative % 57.2 43.0 - 77.0 %   Lymphocytes Relative 27.4 12.0 - 46.0 %   Monocytes Relative 12.6 (H) 3.0 - 12.0 %   Eosinophils Relative 2.1 0.0 - 5.0 %   Basophils Relative 0.7 0.0 - 3.0 %   Neutro Abs 2.5 1.4 - 7.7 K/uL   Lymphs Abs 1.2 0.7 - 4.0 K/uL   Monocytes Absolute 0.6 0.1 - 1.0 K/uL   Eosinophils Absolute 0.1 0.0 - 0.7 K/uL   Basophils Absolute 0.0 0.0 - 0.1 K/uL  Comprehensive metabolic panel  Result Value Ref Range   Sodium 135 135 - 145 mEq/L   Potassium 4.1 3.5 - 5.1 mEq/L   Chloride 97 96 - 112 mEq/L   CO2 30 19 - 32 mEq/L   Glucose, Bld 91 70 - 99 mg/dL   BUN 16 6 - 23 mg/dL   Creatinine, Ser 0.76 0.40 - 1.20 mg/dL   Total Bilirubin 0.5 0.2 - 1.2 mg/dL   Alkaline Phosphatase 53 39 - 117 U/L   AST 16 0 - 37 U/L   ALT 22 0 - 35 U/L   Total Protein 6.9 6.0 - 8.3 g/dL   Albumin 4.5 3.5 - 5.2 g/dL   GFR 79.45 >60.00 mL/min   Calcium 9.5 8.4 - 10.5 mg/dL  Lipid panel  Result Value Ref Range   Cholesterol 157 0 - 200 mg/dL   Triglycerides 237.0 (H) 0.0 - 149.0  mg/dL   HDL 35.40 (L) >39.00 mg/dL   VLDL 47.4 (H) 0.0 - 40.0 mg/dL   Total CHOL/HDL Ratio 4    NonHDL 121.94   LDL cholesterol, direct  Result Value Ref Range   Direct LDL 88.0 mg/dL    Assessment & Plan:  This visit occurred during the SARS-CoV-2 public health emergency.  Safety protocols were in place, including screening questions prior to the visit, additional usage of staff PPE, and extensive cleaning of exam room while observing appropriate contact time as indicated for disinfecting solutions.   Problem List Items Addressed This Visit     Advanced care planning/counseling discussion (Chronic)    Advanced planning: In chart - HCPOA is husband Richard then [place]. Would not want prolonged life support if terminal condition (10/2014).       Medicare annual wellness visit, subsequent - Primary (Chronic)    I have personally reviewed the Medicare Annual Wellness questionnaire and have noted 1. The patient's medical and social history 2. Their use of alcohol, tobacco or illicit drugs 3. Their current medications and supplements 4. The patient's functional ability including ADL's, fall risks,  home safety risks and hearing or visual impairment. Cognitive function has been assessed and addressed as indicated.  5. Diet and physical activity 6. Evidence for depression or mood disorders The patients weight, height, BMI have been recorded in the chart. I have made referrals, counseling and provided education to the patient based on review of the above and I have provided the pt with a written personalized care plan for preventive services. Provider list updated.. See scanned questionairre as needed for further documentation. Reviewed preventative protocols and updated unless pt declined.       Hypertension    Chronic, stable. Continue current regimen.       Relevant Medications   amLODipine (NORVASC) 5 MG tablet   pravastatin (PRAVACHOL) 40 MG tablet   hydrochlorothiazide  (MICROZIDE) 12.5 MG capsule   Hyperlipidemia    Chronic, on pravastatin - continue this. Reviewed elevated triglyceride levels, reviewed diet choices to improve triglycerides.  The 10-year ASCVD risk score (Arnett DK, et al., 2019) is: 11.3%   Values used to calculate the score:     Age: 38 years     Sex: Female     Is Non-Hispanic African American: No     Diabetic: No     Tobacco smoker: No     Systolic Blood Pressure: 224 mmHg     Is BP treated: Yes     HDL Cholesterol: 35.4 mg/dL     Total Cholesterol: 157 mg/dL       Relevant Medications   amLODipine (NORVASC) 5 MG tablet   pravastatin (PRAVACHOL) 40 MG tablet   hydrochlorothiazide (MICROZIDE) 12.5 MG capsule   Osteopenia    Continue regular walking routine and significant dietary calcium intake.  She will remain off vit D given significant sun exposure and recheck levels next labs.       Aortic stenosis, mild    Murmur stays stable, pt remains asymptomatic. Will continue to monitor.       Relevant Medications   amLODipine (NORVASC) 5 MG tablet   pravastatin (PRAVACHOL) 40 MG tablet   hydrochlorothiazide (MICROZIDE) 12.5 MG capsule   Alcohol dependence ([place])    Continues slowly cutting down on alcohol use.       Low serum vitamin B12    Levels now high - will drop dose to MWF        Meds ordered this encounter  Medications   amLODipine (NORVASC) 5 MG tablet    Sig: Take 1 tablet (5 mg total) by mouth daily.    Dispense:  90 tablet    Refill:  3   pravastatin (PRAVACHOL) 40 MG tablet    Sig: Take 1 tablet (40 mg total) by mouth daily.    Dispense:  90 tablet    Refill:  3   hydrochlorothiazide (MICROZIDE) 12.5 MG capsule    Sig: Take 1 capsule (12.5 mg total) by mouth daily.    Dispense:  90 capsule    Refill:  3   Cyanocobalamin (B-12) 1000 MCG SUBL    Sig: Place 1 tablet under the tongue every Monday, Wednesday, and Friday.    Dispense:  30 tablet    No orders of the defined types were placed in this  encounter.    Patient instructions: You are doing well today Drop b12 dosing to MWF.  Return as needed or in 1 year for next wellness visit.   Follow up plan: Return in about 1 year (around 05/28/2022) for medicare wellness visit.  Ria Bush, MD

## 2021-05-28 NOTE — Assessment & Plan Note (Signed)
Continues slowly cutting down on alcohol use.

## 2021-05-28 NOTE — Assessment & Plan Note (Addendum)
Continue regular walking routine and significant dietary calcium intake.  She will remain off vit D given significant sun exposure and recheck levels next labs.

## 2021-05-28 NOTE — Assessment & Plan Note (Signed)
Chronic, on pravastatin - continue this. Reviewed elevated triglyceride levels, reviewed diet choices to improve triglycerides.  The 10-year ASCVD risk score (Arnett DK, et al., 2019) is: 11.3%   Values used to calculate the score:     Age: 70 years     Sex: Female     Is Non-Hispanic African American: No     Diabetic: No     Tobacco smoker: No     Systolic Blood Pressure: 086 mmHg     Is BP treated: Yes     HDL Cholesterol: 35.4 mg/dL     Total Cholesterol: 157 mg/dL

## 2021-05-28 NOTE — Assessment & Plan Note (Signed)
Advanced planning: In chart - HCPOA is husband Sheri Patel then [place]. Would not want prolonged life support if terminal condition (10/2014).  

## 2021-05-28 NOTE — Patient Instructions (Addendum)
You are doing well today Drop b12 dosing to MWF.  Return as needed or in 1 year for next wellness visit.   Health Maintenance After Age 70 After age 15, you are at a higher risk for certain long-term diseases and infections as well as injuries from falls. Falls are a major cause of broken bones and head injuries in people who are older than age 60. Getting regular preventive care can help to keep you healthy and well. Preventive care includes getting regular testing and making lifestyle changes as recommended by your health care provider. Talk with your health care provider about: Which screenings and tests you should have. A screening is a test that checks for a disease when you have no symptoms. A diet and exercise plan that is right for you. What should I know about screenings and tests to prevent falls? Screening and testing are the best ways to find a health problem early. Early diagnosis and treatment give you the best chance of managing medical conditions that are common after age 30. Certain conditions and lifestyle choices may make you more likely to have a fall. Your health care provider may recommend: Regular vision checks. Poor vision and conditions such as cataracts can make you more likely to have a fall. If you wear glasses, make sure to get your prescription updated if your vision changes. Medicine review. Work with your health care provider to regularly review all of the medicines you are taking, including over-the-counter medicines. Ask your health care provider about any side effects that may make you more likely to have a fall. Tell your health care provider if any medicines that you take make you feel dizzy or sleepy. Strength and balance checks. Your health care provider may recommend certain tests to check your strength and balance while standing, walking, or changing positions. Foot health exam. Foot pain and numbness, as well as not wearing proper footwear, can make you more  likely to have a fall. Screenings, including: Osteoporosis screening. Osteoporosis is a condition that causes the bones to get weaker and break more easily. Blood pressure screening. Blood pressure changes and medicines to control blood pressure can make you feel dizzy. Depression screening. You may be more likely to have a fall if you have a fear of falling, feel depressed, or feel unable to do activities that you used to do. Alcohol use screening. Using too much alcohol can affect your balance and may make you more likely to have a fall. Follow these instructions at home: Lifestyle Do not drink alcohol if: Your health care provider tells you not to drink. If you drink alcohol: Limit how much you have to: 0-1 drink a day for women. 0-2 drinks a day for men. Know how much alcohol is in your drink. In the [place] one drink equals one 12 oz bottle of beer (355 mL), one 5 oz glass of wine (148 mL), or one 1 oz glass of hard liquor (44 mL). Do not use any products that contain nicotine or tobacco. These products include cigarettes, chewing tobacco, and vaping devices, such as e-cigarettes. If you need help quitting, ask your health care provider. Activity  Follow a regular exercise program to stay fit. This will help you maintain your balance. Ask your health care provider what types of exercise are appropriate for you. If you need a cane or walker, use it as recommended by your health care provider. Wear supportive shoes that have nonskid soles. Safety  Remove any tripping hazards,  such as rugs, cords, and clutter. Install safety equipment such as grab bars in bathrooms and safety rails on stairs. Keep rooms and walkways well-lit. General instructions Talk with your health care provider about your risks for falling. Tell your health care provider if: You fall. Be sure to tell your health care provider about all falls, even ones that seem minor. You feel dizzy, tiredness (fatigue), or  off-balance. Take over-the-counter and prescription medicines only as told by your health care provider. These include supplements. Eat a healthy diet and maintain a healthy weight. A healthy diet includes low-fat dairy products, low-fat (lean) meats, and fiber from whole grains, beans, and lots of fruits and vegetables. Stay current with your vaccines. Schedule regular health, dental, and eye exams. Summary Having a healthy lifestyle and getting preventive care can help to protect your health and wellness after age 31. Screening and testing are the best way to find a health problem early and help you avoid having a fall. Early diagnosis and treatment give you the best chance for managing medical conditions that are more common for people who are older than age 109. Falls are a major cause of broken bones and head injuries in people who are older than age 69. Take precautions to prevent a fall at home. Work with your health care provider to learn what changes you can make to improve your health and wellness and to prevent falls. This information is not intended to replace advice given to you by your health care provider. Make sure you discuss any questions you have with your health care provider. Document Revised: 11/05/2020 Document Reviewed: 11/05/2020 Elsevier Patient Education  [employer].

## 2021-05-28 NOTE — Assessment & Plan Note (Signed)
Chronic, stable. Continue current regimen. 

## 2021-05-28 NOTE — Assessment & Plan Note (Signed)

## 2021-06-06 ENCOUNTER — Ambulatory Visit
Admission: RE | Admit: 2021-06-06 | Discharge: 2021-06-06 | Disposition: A | Discharge status: Home / Self Care | Payer: Medicare Other | Source: Ambulatory Visit

## 2021-06-06 DIAGNOSIS — Z1231 Encounter for screening mammogram for malignant neoplasm of breast: Secondary | ICD-10-CM | POA: Diagnosis not present

## 2021-07-02 DIAGNOSIS — L720 Epidermal cyst: Secondary | ICD-10-CM | POA: Diagnosis not present

## 2021-07-02 DIAGNOSIS — L570 Actinic keratosis: Secondary | ICD-10-CM | POA: Diagnosis not present

## 2021-07-02 DIAGNOSIS — Z1283 Encounter for screening for malignant neoplasm of skin: Secondary | ICD-10-CM | POA: Diagnosis not present

## 2021-07-02 DIAGNOSIS — L821 Other seborrheic keratosis: Secondary | ICD-10-CM | POA: Diagnosis not present

## 2021-07-02 DIAGNOSIS — X32XXXA Exposure to sunlight, initial encounter: Secondary | ICD-10-CM | POA: Diagnosis not present

## 2022-01-02 ENCOUNTER — Encounter: Payer: Self-pay | Admitting: Family Medicine

## 2022-02-26 IMAGING — MG MM DIGITAL SCREENING BILAT W/ TOMO AND CAD
8 series · 9 of 24 positions shown · non-contrast
Comparison: Previous exam(s).

CLINICAL DATA: Screening.

EXAM:
DIGITAL SCREENING BILATERAL MAMMOGRAM WITH TOMOSYNTHESIS AND CAD
TECHNIQUE: Bilateral screening digital craniocaudal and mediolateral oblique
mammograms were obtained. Bilateral screening digital breast
tomosynthesis was performed. The images were evaluated with
computer-aided detection.

[R CC synth-2D]
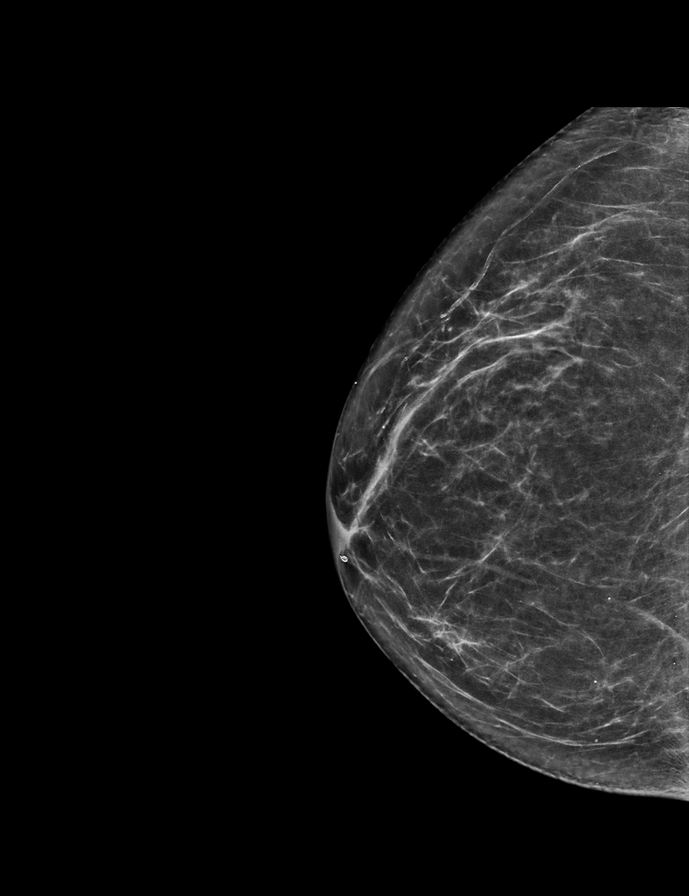

[R MLO synth-2D]
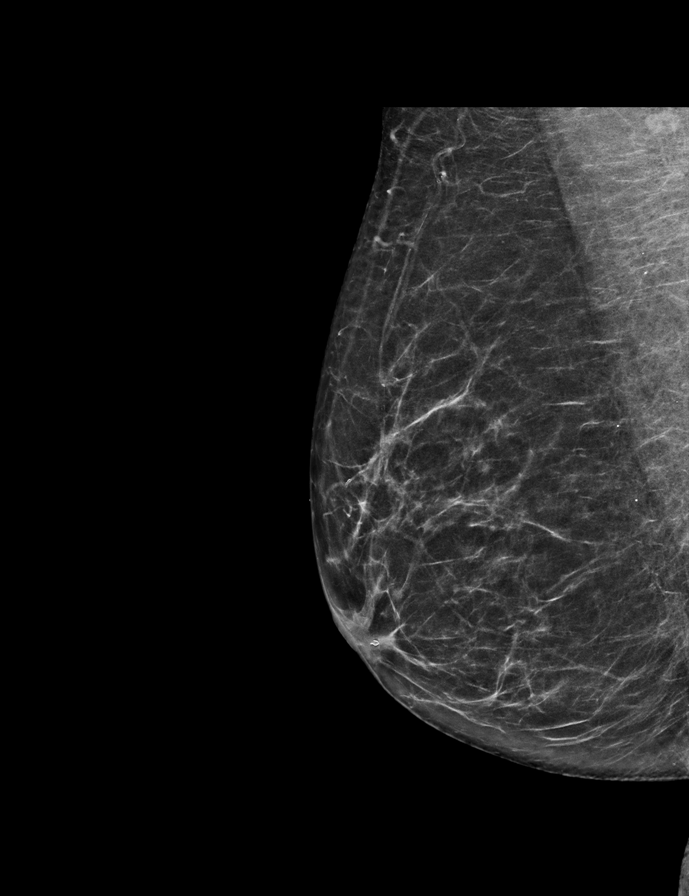

[L MLO synth-2D]
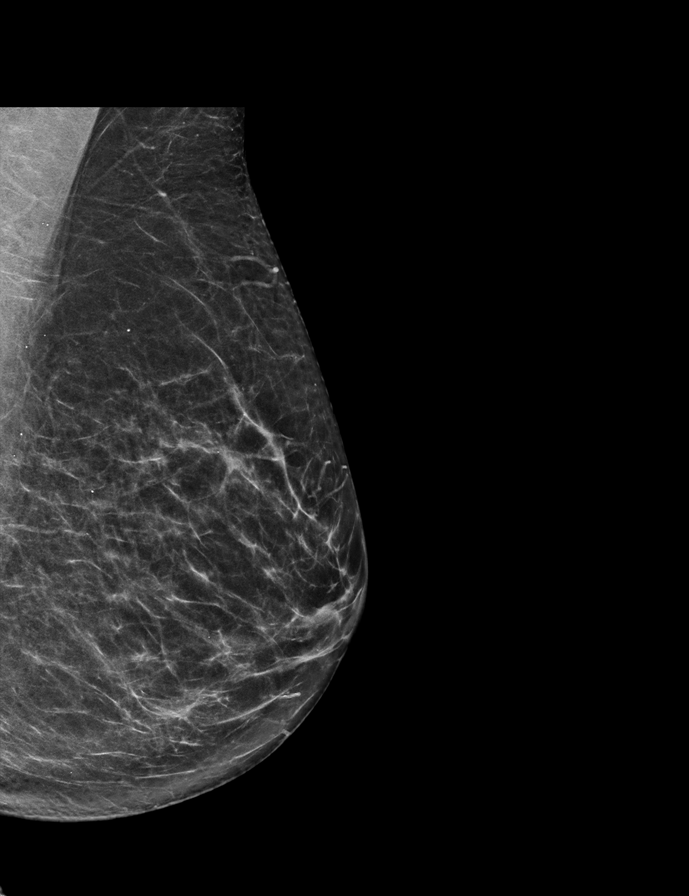

[L CC synth-2D]
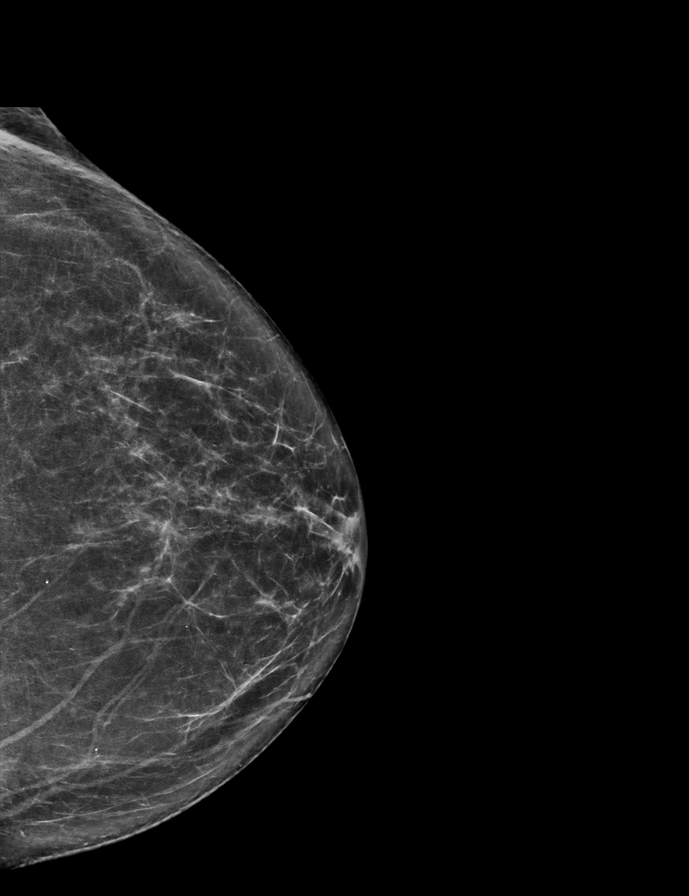

[L MLO tomo · 2 of 62 frames shown]
[frame 21/62]
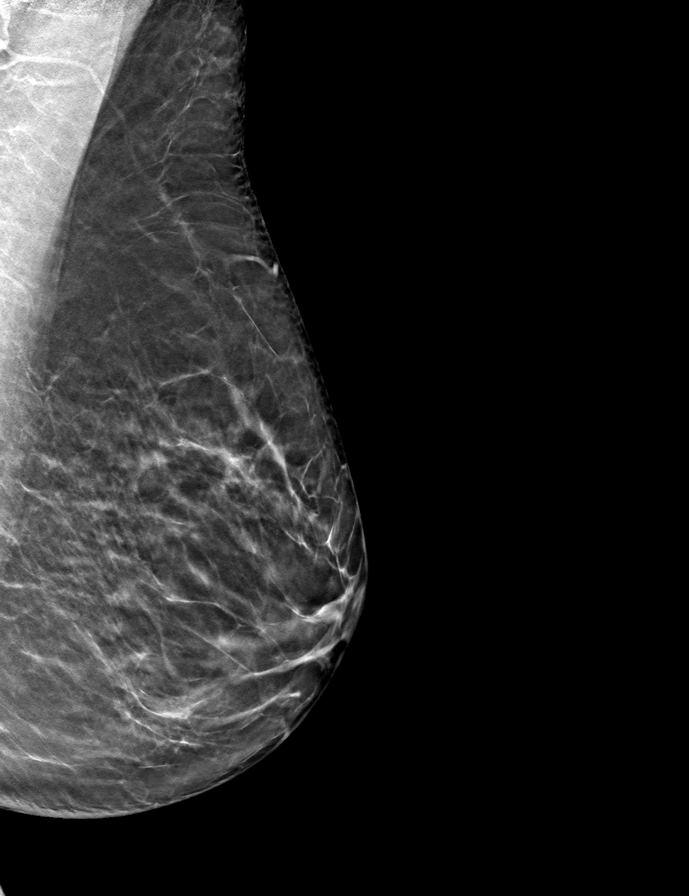
[frame 31/62]
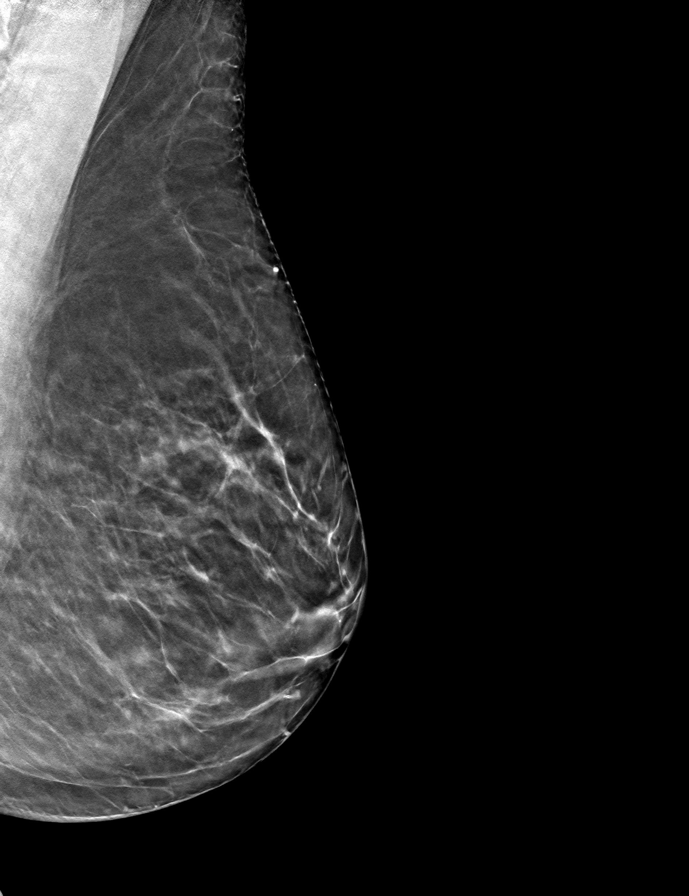

[R MLO tomo · tomo slice 31/62.0]
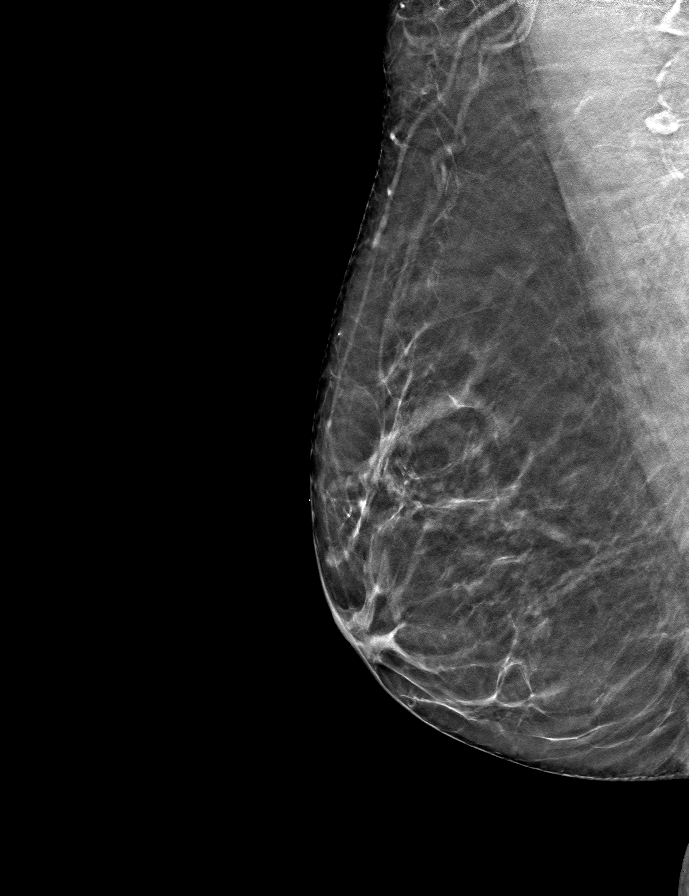

[L CC tomo · tomo slice 32/63.0]
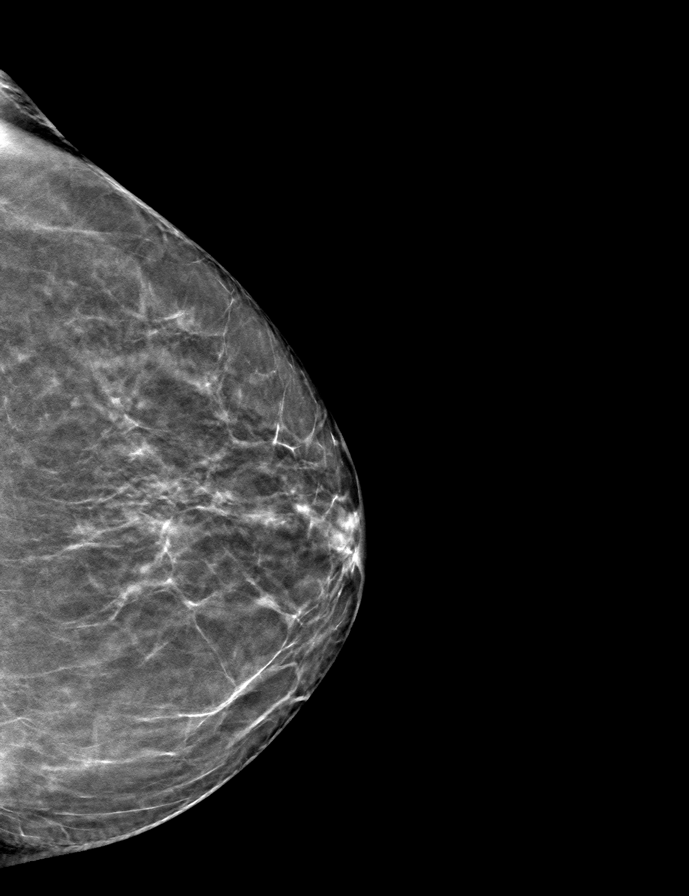

[R CC tomo · tomo slice 31/61.0]
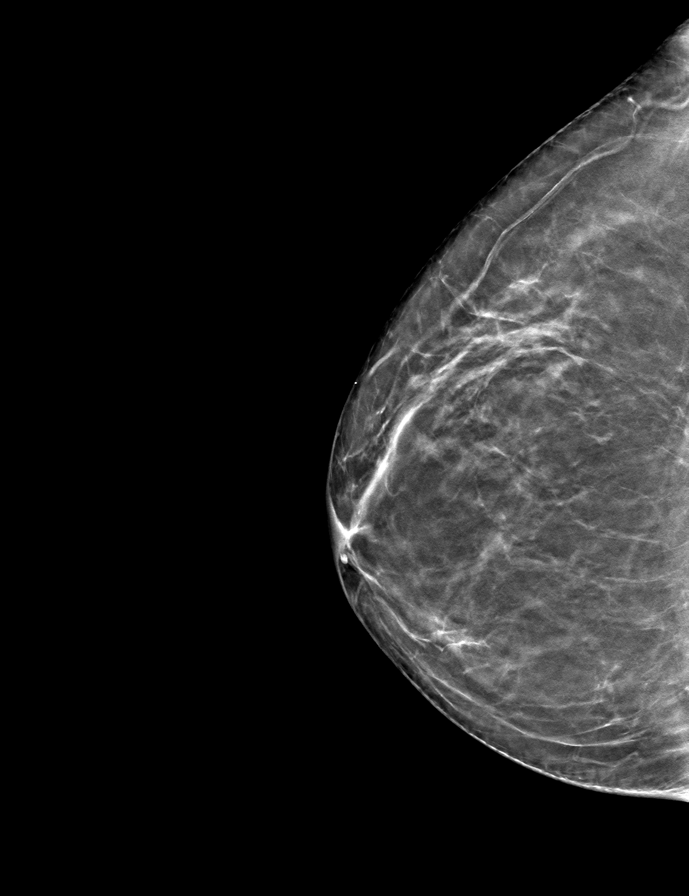

[9 of 24 positions shown; findings below may reference images not displayed]

ACR Breast Density Category b: There are scattered areas of
fibroglandular density.
FINDINGS: There are no findings suspicious for malignancy.
IMPRESSION: No mammographic evidence of malignancy. A result letter of this
screening mammogram will be mailed directly to the patient.

RECOMMENDATION:
Screening mammogram in one year. (Code:51-O-LD2)

BI-RADS CATEGORY  1: Negative.

## 2022-03-18 DIAGNOSIS — H18593 Other hereditary corneal dystrophies, bilateral: Secondary | ICD-10-CM | POA: Diagnosis not present

## 2022-03-18 DIAGNOSIS — D3132 Benign neoplasm of left choroid: Secondary | ICD-10-CM | POA: Diagnosis not present

## 2022-03-18 DIAGNOSIS — H43813 Vitreous degeneration, bilateral: Secondary | ICD-10-CM | POA: Diagnosis not present

## 2022-03-18 DIAGNOSIS — H2513 Age-related nuclear cataract, bilateral: Secondary | ICD-10-CM | POA: Diagnosis not present

## 2022-04-26 ENCOUNTER — Encounter: Payer: Self-pay | Admitting: Family Medicine

## 2022-04-28 ENCOUNTER — Encounter: Payer: Self-pay | Admitting: Family Medicine

## 2022-04-28 NOTE — Telephone Encounter (Signed)
Updated pt's chart.  

## 2022-05-16 ENCOUNTER — Other Ambulatory Visit: Payer: Self-pay | Admitting: Family Medicine

## 2022-05-16 DIAGNOSIS — Z1231 Encounter for screening mammogram for malignant neoplasm of breast: Secondary | ICD-10-CM

## 2022-05-19 ENCOUNTER — Other Ambulatory Visit: Payer: Self-pay | Admitting: Family Medicine

## 2022-05-26 ENCOUNTER — Other Ambulatory Visit: Payer: Self-pay | Admitting: Family Medicine

## 2022-05-26 DIAGNOSIS — M8588 Other specified disorders of bone density and structure, other site: Secondary | ICD-10-CM

## 2022-05-26 DIAGNOSIS — E538 Deficiency of other specified B group vitamins: Secondary | ICD-10-CM

## 2022-05-26 DIAGNOSIS — E782 Mixed hyperlipidemia: Secondary | ICD-10-CM

## 2022-05-27 ENCOUNTER — Other Ambulatory Visit (INDEPENDENT_AMBULATORY_CARE_PROVIDER_SITE_OTHER): Payer: Medicare Other

## 2022-05-27 DIAGNOSIS — E782 Mixed hyperlipidemia: Secondary | ICD-10-CM

## 2022-05-27 DIAGNOSIS — E538 Deficiency of other specified B group vitamins: Secondary | ICD-10-CM

## 2022-05-27 DIAGNOSIS — M8588 Other specified disorders of bone density and structure, other site: Secondary | ICD-10-CM | POA: Diagnosis not present

## 2022-05-27 LAB — COMPREHENSIVE METABOLIC PANEL
ALT: 14 U/L (ref 0–35)
AST: 14 U/L (ref 0–37)
Albumin: 4.6 g/dL (ref 3.5–5.2)
Alkaline Phosphatase: 62 U/L (ref 39–117)
BUN: 12 mg/dL (ref 6–23)
CO2: 30 mEq/L (ref 19–32)
Calcium: 9.6 mg/dL (ref 8.4–10.5)
Chloride: 99 mEq/L (ref 96–112)
Creatinine, Ser: 0.89 mg/dL (ref 0.40–1.20)
GFR: 65.27 mL/min (ref >60.00)
Glucose, Bld: 106 mg/dL — ABNORMAL HIGH (ref 70–99)
Potassium: 3.7 mEq/L (ref 3.5–5.1)
Sodium: 138 mEq/L (ref 135–145)
Total Bilirubin: 0.5 mg/dL (ref 0.2–1.2)
Total Protein: 7.1 g/dL (ref 6.0–8.3)

## 2022-05-27 LAB — VITAMIN D 25 HYDROXY (VIT D DEFICIENCY, FRACTURES): VITD: 33.61 ng/mL (ref 30.00–100.00)

## 2022-05-27 LAB — LIPID PANEL
Cholesterol: 173 mg/dL (ref 0–200)
HDL: 56.2 mg/dL (ref >39.00)
LDL Cholesterol: 92 mg/dL (ref 0–99)
NonHDL: 116.69
Total CHOL/HDL Ratio: 3
Triglycerides: 123 mg/dL (ref 0.0–149.0)
VLDL: 24.6 mg/dL (ref 0.0–40.0)

## 2022-05-27 LAB — VITAMIN B12: Vitamin B-12: 415 pg/mL (ref 211–911)

## 2022-05-29 ENCOUNTER — Ambulatory Visit (INDEPENDENT_AMBULATORY_CARE_PROVIDER_SITE_OTHER): Payer: Medicare Other

## 2022-05-29 VITALS — Ht 66.0 in | Wt 139.0 lb

## 2022-05-29 DIAGNOSIS — Z0000 Encounter for general adult medical examination without abnormal findings: Secondary | ICD-10-CM | POA: Diagnosis not present

## 2022-05-29 NOTE — Progress Notes (Signed)
Virtual Visit via Telephone Note  I connected with  Sheri Patel on 05/29/22 at  3:00 PM EST by telephone and verified that I am speaking with the correct person using two identifiers.  Location: Patient: home Provider: [place] Persons participating in the virtual visit: [place]   I discussed the limitations, risks, security and privacy concerns of performing an evaluation and management service by telephone and the availability of in person appointments. The patient expressed understanding and agreed to proceed.  Interactive audio and video telecommunications were attempted between this nurse and patient, however failed, due to patient having technical difficulties OR patient did not have access to video capability.  We continued and completed visit with audio only.  Some vital signs may be absent or patient reported.   Dionisio David, LPN  Subjective:   Sheri Patel is a 71 y.o. female who presents for Medicare Annual (Subsequent) preventive examination.  Review of Systems     Cardiac Risk Factors include: advanced age (>72mn, >>44women);hypertension     Objective:    There were no vitals filed for this visit. There is no height or weight on file to calculate BMI.     05/29/2022    3:09 PM 05/16/2020    3:34 PM 02/04/2018   10:43 AM  Advanced Directives  Does Patient Have a Medical Advance Directive? No Yes Yes  Type of ACorporate treasurerof [place] will [place] will  Copy of [place] Chart?  No - copy requested Yes  Would patient like information on creating a medical advance directive? No - Patient declined      Current Medications (verified) Outpatient Encounter Medications as of 05/29/2022  Medication Sig   amLODipine (NORVASC) 5 MG tablet TAKE 1 TABLET(5 MG) BY MOUTH DAILY   aspirin EC 81 MG tablet Take 1 tablet (81 mg total) by mouth once a week.  Swallow whole.   Calcium-Vitamin D-Vitamin K (CALCIUM + D + K PO) Take 1 tablet by mouth daily.   Cyanocobalamin (B-12) 1000 MCG SUBL Place 1 tablet under the tongue every Monday, WApril 01, 2024 and Friday.   cycloSPORINE (RESTASIS) 0.05 % ophthalmic emulsion Place 1 drop into both eyes 2 (two) times daily.   hydrochlorothiazide (MICROZIDE) 12.5 MG capsule Take 1 capsule (12.5 mg total) by mouth daily.   pravastatin (PRAVACHOL) 40 MG tablet TAKE 1 TABLET(40 MG) BY MOUTH DAILY   psyllium (METAMUCIL SMOOTH TEXTURE) 58.6 % powder Take 1 packet by mouth daily.   No facility-administered encounter medications on file as of 05/29/2022.    Allergies (verified) Patient has no known allergies.   History: Past Medical History:  Diagnosis Date   Arthritis    hands, off meds   Atrophic vaginitis 3April 01, 2016  by prior OBGYN   History of migraine    resolved after menopause   Hyperlipidemia    Hypertension    Osteopenia 09/2012   DEXA T-1.9 at spine, consider rpt in 3-5 yrs   Situational depression 201-Apr-2011  after brother died (lung cancer)   Ventricular ectopy    benign, s/p cards eval   Past Surgical History:  Procedure Laterality Date   [place]  12[place]   DEXA  09/2012   T -1.9 at spine, -0.7 at hip   TONSILLECTOMY  1966   Family History  Problem Relation Age of Onset   Alcohol abuse Mother  Arthritis Mother    Hyperlipidemia Mother    CAD Mother 44       MI   Hypertension Mother    Hyperlipidemia Father    CAD Father 78       MI   Alcohol abuse Brother    Cancer Brother        lung (smoker)   CAD Brother 14       MI   Hyperlipidemia Brother    Hypertension Brother    Mental illness Brother    Diabetes Brother    Hypertension Sister    Arthritis Maternal Grandmother    Stroke Neg Hx    Social History   Socioeconomic History   Marital status: Married    Spouse name: Not on file   Number of children: Not on file   Years of education:  Not on file   Highest education level: Not on file  Occupational History   Not on file  Tobacco Use   Smoking status: Former   Smokeless tobacco: Never  Vaping Use   Vaping Use: Never used  Substance and Sexual Activity   Alcohol use: Yes    Alcohol/week: 7.0 - 14.0 standard drinks of alcohol    Types: 7 - 14 Glasses of wine per week    Comment: 1-2 glasses of wine a day    Drug use: No   Sexual activity: Not on file  Other Topics Concern   Not on file  Social History Narrative   Lives with husband, no pets   [place]   Occupation: retired, 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[employer])   [place]    Diet: good water, fruits/vegetables daily      Derm: Electronics engineer    Ophtho: Groat   Social Determinants of Health   Financial Resource Strain: Echo  (05/29/2022)   Overall Financial Resource Strain (CARDIA)    Difficulty of Paying Living Expenses: Not hard at all  Food Insecurity: No Food Insecurity (05/29/2022)   Hunger Vital Sign    Worried About [place] in the Last Year: Never true    [place] in the Last Year: Never true  Transportation Needs: No Transportation Needs (05/29/2022)   PRAPARE - Hydrologist (Medical): No    Lack of Transportation (Non-Medical): No  Physical Activity: Sufficiently Active (05/29/2022)   Exercise Vital Sign    Days of Exercise per Week: 5 days    Minutes of Exercise per Session: 50 min  Stress: No Stress Concern Present (05/29/2022)   [place]    Feeling of Stress : Not at all  Social Connections: Moderately Integrated (05/29/2022)   Social Connection and Isolation Panel [NHANES]    Frequency of Communication with Friends and Family: More than three times a week    Frequency of Social Gatherings with Friends and Family: Three times a week    Attends Religious Services: More than 4 times per year     Active Member of Clubs or Organizations: No    Attends Archivist Meetings: Never    Marital Status: Married    Tobacco Counseling Counseling given: Not Answered   Clinical Intake:  Pre-visit preparation completed: Yes  Pain : No/denies pain     Nutritional Risks: None Diabetes: No  How often do you need to have someone help you when you read instructions, pamphlets, or other written materials  from your doctor or pharmacy?: 1 - Never  Diabetic?no  Interpreter Needed?: No  Information entered by :: Kirke Shaggy, LPN   Activities of Daily Living    05/29/2022    3:09 PM  In your present state of health, do you have any difficulty performing the following activities:  Hearing? 0  Vision? 0  Difficulty concentrating or making decisions? 0  Walking or climbing stairs? 0  Dressing or bathing? 0  Doing errands, shopping? 0  Preparing Food and eating ? N  Using the Toilet? N  In the past six months, have you accidently leaked urine? N  Do you have problems with loss of bowel control? N  Managing your Medications? N  Managing your Finances? N  Housekeeping or managing your Housekeeping? N    Patient Care Team: Ria Bush, MD as PCP - General (Family Medicine)  Indicate any recent Medical Services you may have received from other than [facility] providers in the past year (date may be approximate).     Assessment:   This is a routine wellness examination for [place].  Hearing/Vision screen Hearing Screening - Comments:: No aids Vision Screening - Comments:: Readers- Dr.Groat  Dietary issues and exercise activities discussed: Current Exercise Habits: Home exercise routine, Type of exercise: walking, Time (Minutes): 50, Frequency (Times/Week): 5, Weekly Exercise (Minutes/Week): 250, Intensity: Mild   Goals Addressed             This Visit's Progress    DIET - EAT MORE FRUITS AND VEGETABLES         Depression Screen    05/29/2022    3:07  PM 05/28/2021    2:18 PM 05/16/2020    3:35 PM 09/13/2019   12:23 PM 04/11/2019    3:57 PM 02/04/2018   10:42 AM 10/22/2016    9:51 AM  PHQ 2/9 Scores  PHQ - 2 Score 0 0 0 0 2 0 0  PHQ- 9 Score 0  0 4 12 0     Fall Risk    05/29/2022    3:09 PM 05/28/2021    2:18 PM 05/16/2020    3:35 PM 04/11/2019    3:57 PM 02/04/2018   10:42 AM  Fall Risk   Falls in the past year? 0 0 0 0 No  Number falls in past yr: 0  0 0   Injury with Fall? 0  0 0   Risk for fall due to : No Fall Risks  Medication side effect    Follow up Falls prevention discussed;Falls evaluation completed  Falls evaluation completed;Falls prevention discussed      FALL RISK PREVENTION PERTAINING TO THE HOME:  Any stairs in or around the home? No  If so, are there any without handrails? No  Home free of loose throw rugs in walkways, pet beds, electrical cords, etc? Yes  Adequate lighting in your home to reduce risk of falls? Yes   ASSISTIVE DEVICES UTILIZED TO PREVENT FALLS:  Life alert? No  Use of a cane, walker or w/c? No  Grab bars in the bathroom? No  Shower chair or bench in shower? No  Elevated toilet seat or a handicapped toilet? No   Cognitive Function:    05/16/2020    3:37 PM 02/04/2018   10:42 AM  MMSE - Mini Mental State Exam  Orientation to time 5 5  Orientation to Place 5 5  Registration 3 3  Attention/ Calculation 5 0  Recall 3 3  Language- name 2 objects  0  Language- repeat 1 1  Language- follow 3 step command  3  Language- read & follow direction  0  Write a sentence  0  Copy design  0  Total score  20        05/29/2022    3:10 PM  6CIT Screen  What Year? 0 points  What month? 0 points  What time? 0 points  Count back from 20 0 points  Months in reverse 0 points  Repeat phrase 0 points  Total Score 0 points    Immunizations Immunization History  Administered Date(s) Administered   Fluad Quad(high Dose 65+) 04/25/2022   Influenza, High Dose Seasonal PF 04/16/2017,  05/15/2018, 02/26/2019, 05/06/2020, 05/15/2021   Influenza,inj,Quad PF,6+ Mos 05/04/2014   Influenza-Unspecified 06/07/2015   PFIZER Comirnaty(Gray Top)Covid-19 Tri-Sucrose Vaccine 04/25/2022   PFIZER(Purple Top)SARS-COV-2 Vaccination 07/19/2019, 08/09/2019, 04/06/2020, 10/05/2020   Pfizer Covid-19 Vaccine Bivalent Booster 26yr & up 05/15/2021   Pneumococcal Conjugate-13 10/22/2016   Pneumococcal Polysaccharide-23 02/04/2018   Td 06/30/2008, 05/04/2019   Zoster Recombinat (Shingrix) 03/20/2018, 07/19/2018   Zoster, Live 06/30/2010    TDAP status: Up to date  Flu Vaccine status: Up to date  Pneumococcal vaccine status: Up to date  Covid-19 vaccine status: Completed vaccines  Qualifies for Shingles Vaccine? Yes   Zostavax completed Yes   Shingrix Completed?: Yes  Screening Tests Health Maintenance  Topic Date Due   COLONOSCOPY (Pts [phone] coverage will need to be confirmed)  01/08/2022   MAMMOGRAM  06/06/2022   COVID-19 Vaccine (7 - 2023-24 season) 06/20/2022   Medicare Annual Wellness (AWV)  05/30/2023   DTaP/Tdap/Td (3 - Tdap) 05/03/2029   Pneumonia Vaccine 6535Years old  Completed   INFLUENZA VACCINE  Completed   DEXA SCAN  Completed   Hepatitis C Screening  Completed   Zoster Vaccines- Shingrix  Completed   HPV VACCINES  Aged Out    Health Maintenance  Health Maintenance Due  Topic Date Due   COLONOSCOPY (Pts [phone] coverage will need to be confirmed)  01/08/2022    Colorectal cancer screening: Type of screening: Colonoscopy. Completed 01/09/12. Repeat every 10 years  Mammogram status: Completed scheduled on 07/10/22. Repeat every year  Bone Density status: Completed 04/13/18. Results reflect: Bone density results: OSTEOPENIA. Repeat every 5 years.  Lung Cancer Screening: (Low Dose CT Chest recommended if Age 62-34-80ars, 30 pack-year currently smoking OR have quit w/in 15years.) does not qualify.   Additional Screening:  Hepatitis C  Screening: does qualify; Completed 10/16/16  Vision Screening: Recommended annual ophthalmology exams for early detection of glaucoma and other disorders of the eye. Is the patient up to date with their annual eye exam?  Yes  Who is the provider or what is the name of the office in which the patient attends annual eye exams? Dr.Groat If pt is not established with a provider, would they like to be referred to a provider to establish care? No .   Dental Screening: Recommended annual dental exams for proper oral hygiene  Community Resource Referral / Chronic Care Management: CRR required this visit?  No   CCM required this visit?  No      Plan:     I have personally reviewed and noted the following in the patient's chart:   Medical and social history Use of alcohol, tobacco or illicit drugs  Current medications and supplements including opioid prescriptions. Patient is not currently taking opioid prescriptions. Functional ability and status Nutritional status Physical activity Advanced  directives List of other physicians Hospitalizations, surgeries, and ER visits in previous 12 months Vitals Screenings to include cognitive, depression, and falls Referrals and appointments  In addition, I have reviewed and discussed with patient certain preventive protocols, quality metrics, and best practice recommendations. A written personalized care plan for preventive services as well as general preventive health recommendations were provided to patient.     Dionisio David, LPN   24/04/4642   Nurse Notes: none

## 2022-05-29 NOTE — Patient Instructions (Signed)
Sheri Patel , Thank you for taking time to come for your Medicare Wellness Visit. I appreciate your ongoing commitment to your health goals. Please review the following plan we discussed and let me know if I can assist you in the future.   Screening recommendations/referrals: Colonoscopy: 01/09/12 Mammogram: scheduled 07/10/22 Bone Density: 04/13/18 Recommended yearly ophthalmology/optometry visit for glaucoma screening and checkup Recommended yearly dental visit for hygiene and checkup  Vaccinations: Influenza vaccine: 05/15/21 Pneumococcal vaccine: 02/04/18 Tdap vaccine: 05/04/19 Shingles vaccine: Zostavax 06/30/10   Shingrix 03/20/18, 07/19/18   Covid-19:07/19/19, 08/09/19, 04/06/20, 10/05/20, 05/15/21, 04/25/22  Advanced directives: no  Conditions/risks identified: none  Next appointment: Follow up in one year for your annual wellness visit 06/01/23 @ 2:15 pm by phone   Preventive Care 65 Years and Older, Female Preventive care refers to lifestyle choices and visits with your health care provider that can promote health and wellness. What does preventive care include? A yearly physical exam. This is also called an annual well check. Dental exams once or twice a year. Routine eye exams. Ask your health care provider how often you should have your eyes checked. Personal lifestyle choices, including: Daily care of your teeth and gums. Regular physical activity. Eating a healthy diet. Avoiding tobacco and drug use. Limiting alcohol use. Practicing safe sex. Taking low-dose aspirin every day. Taking vitamin and mineral supplements as recommended by your health care provider. What happens during an annual well check? The services and screenings done by your health care provider during your annual well check will depend on your age, overall health, lifestyle risk factors, and family history of disease. Counseling  Your health care provider may ask you questions about your: Alcohol  use. Tobacco use. Drug use. Emotional well-being. Home and relationship well-being. Sexual activity. Eating habits. History of falls. Memory and ability to understand (cognition). Work and work Statistician. Reproductive health. Screening  You may have the following tests or measurements: Height, weight, and BMI. Blood pressure. Lipid and cholesterol levels. These may be checked every 5 years, or more frequently if you are over 19 years old. Skin check. Lung cancer screening. You may have this screening every year starting at age 33 if you have a 30-pack-year history of smoking and currently smoke or have quit within the past 15 years. Fecal occult blood test (FOBT) of the stool. You may have this test every year starting at age 68. Flexible sigmoidoscopy or colonoscopy. You may have a sigmoidoscopy every 5 years or a colonoscopy every 10 years starting at age 9. Hepatitis C blood test. Hepatitis B blood test. Sexually transmitted disease (STD) testing. Diabetes screening. This is done by checking your blood sugar (glucose) after you have not eaten for a while (fasting). You may have this done every 1-3 years. Bone density scan. This is done to screen for osteoporosis. You may have this done starting at age 25. Mammogram. This may be done every 1-2 years. Talk to your health care provider about how often you should have regular mammograms. Talk with your health care provider about your test results, treatment options, and if necessary, the need for more tests. Vaccines  Your health care provider may recommend certain vaccines, such as: Influenza vaccine. This is recommended every year. Tetanus, diphtheria, and acellular pertussis (Tdap, Td) vaccine. You may need a Td booster every 10 years. Zoster vaccine. You may need this after age 53. Pneumococcal 13-valent conjugate (PCV13) vaccine. One dose is recommended after age 53. Pneumococcal polysaccharide (PPSV23) vaccine. One dose  is  recommended after age 63. Talk to your health care provider about which screenings and vaccines you need and how often you need them. This information is not intended to replace advice given to you by your health care provider. Make sure you discuss any questions you have with your health care provider. Document Released: 07/13/2015 Document Revised: 03/05/2016 Document Reviewed: 04/17/2015 Elsevier Interactive Patient Education  2017 [place] Prevention in the Home Falls can cause injuries. They can happen to people of all ages. There are many things you can do to make your home safe and to help prevent falls. What can I do on the outside of my home? Regularly fix the edges of walkways and driveways and fix any cracks. Remove anything that might make you trip as you walk through a door, such as a raised step or threshold. Trim any bushes or trees on the path to your home. Use bright outdoor lighting. Clear any walking paths of anything that might make someone trip, such as rocks or tools. Regularly check to see if handrails are loose or broken. Make sure that both sides of any steps have handrails. Any raised decks and porches should have guardrails on the edges. Have any leaves, snow, or ice cleared regularly. Use sand or salt on walking paths during winter. Clean up any spills in your garage right away. This includes oil or grease spills. What can I do in the bathroom? Use night lights. Install grab bars by the toilet and in the tub and shower. Do not use towel bars as grab bars. Use non-skid mats or decals in the tub or shower. If you need to sit down in the shower, use a plastic, non-slip stool. Keep the floor dry. Clean up any water that spills on the floor as soon as it happens. Remove soap buildup in the tub or shower regularly. Attach bath mats securely with double-sided non-slip rug tape. Do not have throw rugs and other things on the floor that can make you  trip. What can I do in the bedroom? Use night lights. Make sure that you have a light by your bed that is easy to reach. Do not use any sheets or blankets that are too big for your bed. They should not hang down onto the floor. Have a firm chair that has side arms. You can use this for support while you get dressed. Do not have throw rugs and other things on the floor that can make you trip. What can I do in the kitchen? Clean up any spills right away. Avoid walking on wet floors. Keep items that you use a lot in easy-to-reach places. If you need to reach something above you, use a strong step stool that has a grab bar. Keep electrical cords out of the way. Do not use floor polish or wax that makes floors slippery. If you must use wax, use non-skid floor wax. Do not have throw rugs and other things on the floor that can make you trip. What can I do with my stairs? Do not leave any items on the stairs. Make sure that there are handrails on both sides of the stairs and use them. Fix handrails that are broken or loose. Make sure that handrails are as long as the stairways. Check any carpeting to make sure that it is firmly attached to the stairs. Fix any carpet that is loose or worn. Avoid having throw rugs at the top or bottom of the stairs.  If you do have throw rugs, attach them to the floor with carpet tape. Make sure that you have a light switch at the top of the stairs and the bottom of the stairs. If you do not have them, ask someone to add them for you. What else can I do to help prevent falls? Wear shoes that: Do not have high heels. Have rubber bottoms. Are comfortable and fit you well. Are closed at the toe. Do not wear sandals. If you use a stepladder: Make sure that it is fully opened. Do not climb a closed stepladder. Make sure that both sides of the stepladder are locked into place. Ask someone to hold it for you, if possible. Clearly mark and make sure that you can  see: Any grab bars or handrails. First and last steps. Where the edge of each step is. Use tools that help you move around (mobility aids) if they are needed. These include: Canes. Walkers. Scooters. Crutches. Turn on the lights when you go into a dark area. Replace any light bulbs as soon as they burn out. Set up your furniture so you have a clear path. Avoid moving your furniture around. If any of your floors are uneven, fix them. If there are any pets around you, be aware of where they are. Review your medicines with your doctor. Some medicines can make you feel dizzy. This can increase your chance of falling. Ask your doctor what other things that you can do to help prevent falls. This information is not intended to replace advice given to you by your health care provider. Make sure you discuss any questions you have with your health care provider. Document Released: 04/12/2009 Document Revised: 11/22/2015 Document Reviewed: 07/21/2014 Elsevier Interactive Patient Education  2017 [employer].

## 2022-06-03 ENCOUNTER — Encounter: Payer: Medicare Other | Admitting: Family Medicine

## 2022-06-06 ENCOUNTER — Encounter: Payer: Self-pay | Admitting: Family Medicine

## 2022-06-06 ENCOUNTER — Ambulatory Visit (INDEPENDENT_AMBULATORY_CARE_PROVIDER_SITE_OTHER): Payer: Medicare Other | Admitting: Family Medicine

## 2022-06-06 VITALS — BP 132/74 | HR 84 | Temp 97.4°F | Ht 65.75 in | Wt 142.2 lb

## 2022-06-06 DIAGNOSIS — E782 Mixed hyperlipidemia: Secondary | ICD-10-CM

## 2022-06-06 DIAGNOSIS — E538 Deficiency of other specified B group vitamins: Secondary | ICD-10-CM

## 2022-06-06 DIAGNOSIS — Z1211 Encounter for screening for malignant neoplasm of colon: Secondary | ICD-10-CM

## 2022-06-06 DIAGNOSIS — F411 Generalized anxiety disorder: Secondary | ICD-10-CM | POA: Insufficient documentation

## 2022-06-06 DIAGNOSIS — F419 Anxiety disorder, unspecified: Secondary | ICD-10-CM

## 2022-06-06 DIAGNOSIS — M25551 Pain in right hip: Secondary | ICD-10-CM

## 2022-06-06 DIAGNOSIS — I350 Nonrheumatic aortic (valve) stenosis: Secondary | ICD-10-CM | POA: Diagnosis not present

## 2022-06-06 DIAGNOSIS — Z7189 Other specified counseling: Secondary | ICD-10-CM

## 2022-06-06 DIAGNOSIS — M25552 Pain in left hip: Secondary | ICD-10-CM

## 2022-06-06 DIAGNOSIS — I10 Essential (primary) hypertension: Secondary | ICD-10-CM

## 2022-06-06 DIAGNOSIS — F1029 Alcohol dependence with unspecified alcohol-induced disorder: Secondary | ICD-10-CM

## 2022-06-06 DIAGNOSIS — M8588 Other specified disorders of bone density and structure, other site: Secondary | ICD-10-CM

## 2022-06-06 MED ORDER — BUSPIRONE HCL 7.5 MG PO TABS: 7.5000 mg | ORAL_TABLET | Freq: Two times a day (BID) | ORAL | 4 refills | Status: DC

## 2022-06-06 MED ORDER — AMLODIPINE BESYLATE 5 MG PO TABS: 5.0000 mg | ORAL_TABLET | Freq: Every day | ORAL | Status: DC

## 2022-06-06 MED ORDER — HYDROCHLOROTHIAZIDE 12.5 MG PO CAPS: 12.5000 mg | ORAL_CAPSULE | Freq: Every day | ORAL | 4 refills | Status: DC

## 2022-06-06 MED ORDER — PRAVASTATIN SODIUM 40 MG PO TABS: ORAL_TABLET | ORAL | 4 refills | Status: DC

## 2022-06-06 NOTE — Assessment & Plan Note (Signed)
Levels repleted on MWF dosing.

## 2022-06-06 NOTE — Assessment & Plan Note (Addendum)
Has significantly cut down on use, down to 1-2 glasses wine daily.

## 2022-06-06 NOTE — Assessment & Plan Note (Signed)
Vit D levels stable on current regimen.  Rpt DEXA 03/2023

## 2022-06-06 NOTE — Assessment & Plan Note (Signed)
Chronic, stable period on pravastatin - continue this. The 10-year ASCVD risk score (Arnett DK, et al., 2019) is: 14.2%   Values used to calculate the score:     Age: 71 years     Sex: Female     Is Non-Hispanic African American: No     Diabetic: No     Tobacco smoker: No     Systolic Blood Pressure: 597 mmHg     Is BP treated: Yes     HDL Cholesterol: 56.2 mg/dL     Total Cholesterol: 173 mg/dL

## 2022-06-06 NOTE — Assessment & Plan Note (Signed)
Notices increased anxiety in setting of husband's recent ER visit and cardiac workup for syncope. She was previously on lexapro with good results. She notes predominant anxiety difficulty, no depressed mood or anhedonia. She also notes anxiety around driving.  Reviewed possible treatment options. Reviewed possible side effects of buspar. Will start buspar 7.'5mg'$  nightly x 1 wk then increase to BID. Update with effect

## 2022-06-06 NOTE — Assessment & Plan Note (Signed)
Chronic, stable on current regimen - continue. 

## 2022-06-06 NOTE — Patient Instructions (Addendum)
We will sign you up for Cologuard.  For hip pain - I think you have hip bursitis or greater trochanteric pain syndrome. Try exercises provided today. If worsening, schedule appointment to see Dr Lorelei Pont sports medicine for evaluation.  For mood, try buspar 7.'5mg'$  twice daily, first week take 7.'5mg'$  at night.  Let us know how you do with this.  Return in 1 year for next wellness visit.   Health Maintenance After Age 71 After age 4, you are at a higher risk for certain long-term diseases and infections as well as injuries from falls. Falls are a major cause of broken bones and head injuries in people who are older than age 15. Getting regular preventive care can help to keep you healthy and well. Preventive care includes getting regular testing and making lifestyle changes as recommended by your health care provider. Talk with your health care provider about: Which screenings and tests you should have. A screening is a test that checks for a disease when you have no symptoms. A diet and exercise plan that is right for you. What should I know about screenings and tests to prevent falls? Screening and testing are the best ways to find a health problem early. Early diagnosis and treatment give you the best chance of managing medical conditions that are common after age 82. Certain conditions and lifestyle choices may make you more likely to have a fall. Your health care provider may recommend: Regular vision checks. Poor vision and conditions such as cataracts can make you more likely to have a fall. If you wear glasses, make sure to get your prescription updated if your vision changes. Medicine review. Work with your health care provider to regularly review all of the medicines you are taking, including over-the-counter medicines. Ask your health care provider about any side effects that may make you more likely to have a fall. Tell your health care provider if any medicines that you take make you feel dizzy  or sleepy. Strength and balance checks. Your health care provider may recommend certain tests to check your strength and balance while standing, walking, or changing positions. Foot health exam. Foot pain and numbness, as well as not wearing proper footwear, can make you more likely to have a fall. Screenings, including: Osteoporosis screening. Osteoporosis is a condition that causes the bones to get weaker and break more easily. Blood pressure screening. Blood pressure changes and medicines to control blood pressure can make you feel dizzy. Depression screening. You may be more likely to have a fall if you have a fear of falling, feel depressed, or feel unable to do activities that you used to do. Alcohol use screening. Using too much alcohol can affect your balance and may make you more likely to have a fall. Follow these instructions at home: Lifestyle Do not drink alcohol if: Your health care provider tells you not to drink. If you drink alcohol: Limit how much you have to: 0-1 drink a day for women. 0-2 drinks a day for men. Know how much alcohol is in your drink. In the [place] one drink equals one 12 oz bottle of beer (355 mL), one 5 oz glass of wine (148 mL), or one 1 oz glass of hard liquor (44 mL). Do not use any products that contain nicotine or tobacco. These products include cigarettes, chewing tobacco, and vaping devices, such as e-cigarettes. If you need help quitting, ask your health care provider. Activity  Follow a regular exercise program to stay fit.  This will help you maintain your balance. Ask your health care provider what types of exercise are appropriate for you. If you need a cane or walker, use it as recommended by your health care provider. Wear supportive shoes that have nonskid soles. Safety  Remove any tripping hazards, such as rugs, cords, and clutter. Install safety equipment such as grab bars in bathrooms and safety rails on stairs. Keep rooms and walkways  well-lit. General instructions Talk with your health care provider about your risks for falling. Tell your health care provider if: You fall. Be sure to tell your health care provider about all falls, even ones that seem minor. You feel dizzy, tiredness (fatigue), or off-balance. Take over-the-counter and prescription medicines only as told by your health care provider. These include supplements. Eat a healthy diet and maintain a healthy weight. A healthy diet includes low-fat dairy products, low-fat (lean) meats, and fiber from whole grains, beans, and lots of fruits and vegetables. Stay current with your vaccines. Schedule regular health, dental, and eye exams. Summary Having a healthy lifestyle and getting preventive care can help to protect your health and wellness after age 53. Screening and testing are the best way to find a health problem early and help you avoid having a fall. Early diagnosis and treatment give you the best chance for managing medical conditions that are more common for people who are older than age 18. Falls are a major cause of broken bones and head injuries in people who are older than age 25. Take precautions to prevent a fall at home. Work with your health care provider to learn what changes you can make to improve your health and wellness and to prevent falls. This information is not intended to replace advice given to you by your health care provider. Make sure you discuss any questions you have with your health care provider. Document Revised: 11/05/2020 Document Reviewed: 11/05/2020 Elsevier Patient Education  [place].

## 2022-06-06 NOTE — Assessment & Plan Note (Signed)
Stable murmur, echo showed mild aortic sclerosis without stenosis.

## 2022-06-06 NOTE — Assessment & Plan Note (Signed)
Advanced planning: In chart - HCPOA is husband Richard then [place]. Would not want prolonged life support if terminal condition (10/2014).

## 2022-06-06 NOTE — Assessment & Plan Note (Signed)
L>R pain present for the past several months in h/o frequent regular walking. Exam today not consistent with hip osteoarthritis but rather greater trochanteric pain. Discussed this, provided with handout on hip exercises, suggested sports med f/u if ongoing pain. Could alternatively consider course of PT. Also rec heating pad, tylenol/ibuprofen PRN pain.

## 2022-06-06 NOTE — Progress Notes (Signed)
Patient ID: Sheri Patel, female    DOB: 1950-09-25, 71 y.o.   MRN: [id number]  This visit was conducted in person.  BP 132/74   Pulse 84   Temp (!) 97.4 F (36.3 C) (Temporal)   Ht 5' 5.75" (1.67 m)   Wt 142 lb 3.2 oz (64.5 kg)   SpO2 96%   BMI 23.13 kg/m    CC: CPE Subjective:   HPI: Sheri Patel is a 71 y.o. female presenting on 06/06/2022 for Annual Exam (MCR prt 2. )   Saw health advisor last week for medicare wellness visit. Note reviewed.    No results found.  Flowsheet Row Clinical Support from 05/29/2022 in [place] at [facility]  PHQ-2 Total Score 0          05/29/2022    3:09 PM 05/28/2021    2:18 PM 05/16/2020    3:35 PM 04/11/2019    3:57 PM 02/04/2018   10:42 AM  Fall Risk   Falls in the past year? 0 0 0 0 No  Number falls in past yr: 0  0 0   Injury with Fall? 0  0 0   Risk for fall due to : No Fall Risks  Medication side effect    Follow up Falls prevention discussed;Falls evaluation completed  Falls evaluation completed;Falls prevention discussed     Husband had syncopal episode after recent eye surgery, currently undergoing cardiac workup for this.   Over last few months she's noticing bilateral L>R lateral hip pain - taking aspirin at night time due to this. Less active due to hip pain. No back pain, numbness or weakness of legs or feet.   She notes increased anxiety around driving and husband's recent medical scare. Previously on lexapro with benefit.   Preventative: COLONOSCOPY Date: 12/2011 normal ([facility] in [place]). She would like to do  Well woman exam at our office 08/2015 and 03/2019 WNL. Will discontinue cervical cancer screening. Denies pelvic pain, vaginal bleeding, or vaginal/vulvar skin changes [facility]  G3P2  DEXA Date: 09/2012 T -1.9 at spine, -0.7 at hip, was on fosamax for 5 yrs, stopped 10/2013.  DEXA 03/2018 T -1.3 spine.  Lung cancer screening - not eligible Flu  shot - yearly  [place] 07/2019, 08/2019, booster 03/2020, 09/2020, bivalent booster 04/2021, again 04/2022 Td 2010, 05/2019 Prevnar-13 09/2016, pneumovax 01/2018 Zostavax - 2012  Shingrix 02/2018, 06/2018  Advanced planning: In chart - HCPOA is husband Richard then [place]. Would not want prolonged life support if terminal condition (10/2014).  Seat belt use discussed Sunscreen use discussed. No changing moles on skin. Sees derm. Ex smoker - remote  Alcohol - drinking <2 glasses of wine daily. Previously underwent treatment for alcohol use disorder [facility].  Sleep - averaging 6 hours/night. Has tried melatonin and ambien in the past.  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 Interim medical history since our last visit reviewed. Allergies and medications reviewed and updated. Outpatient Medications Prior to Visit  Medication Sig Dispense Refill   aspirin EC  81 MG tablet Take 1 tablet (81 mg total) by mouth once a week. Swallow whole.     Calcium-Vitamin D-Vitamin K (CALCIUM + D + K PO) Take 1 tablet by mouth daily.     Cyanocobalamin (B-12) 1000 MCG SUBL Place 1 tablet under the tongue every Monday, Wednesday, and Friday. 30 tablet    cycloSPORINE (RESTASIS) 0.05 % ophthalmic emulsion Place 1 drop into both eyes 2 (two) times daily.     psyllium (METAMUCIL SMOOTH TEXTURE) 58.6 % powder Take 1 packet by mouth daily.     amLODipine (NORVASC) 5 MG tablet TAKE 1 TABLET(5 MG) BY MOUTH DAILY 90 tablet 0   hydrochlorothiazide (MICROZIDE) 12.5 MG capsule Take 1  capsule (12.5 mg total) by mouth daily. 90 capsule 3   pravastatin (PRAVACHOL) 40 MG tablet TAKE 1 TABLET(40 MG) BY MOUTH DAILY 90 tablet 0   No facility-administered medications prior to visit.     Per HPI unless specifically indicated in ROS section below Review of Systems  Objective:  BP 132/74   Pulse 84   Temp (!) 97.4 F (36.3 C) (Temporal)   Ht 5' 5.75" (1.67 m)   Wt 142 lb 3.2 oz (64.5 kg)   SpO2 96%   BMI 23.13 kg/m   Wt Readings from Last 3 Encounters:  06/06/22 142 lb 3.2 oz (64.5 kg)  05/29/22 139 lb (63 kg)  05/28/21 139 lb 5 oz (63.2 kg)      Physical Exam Vitals and nursing note reviewed.  Constitutional:      Appearance: Normal appearance. She is not ill-appearing.  HENT:     Head: Normocephalic and atraumatic.     Right Ear: Tympanic membrane, ear canal and external ear normal. There is no impacted cerumen.     Left Ear: Tympanic membrane, ear canal and external ear normal. There is no impacted cerumen.  Eyes:     General:        Right eye: No discharge.        Left eye: No discharge.     Extraocular Movements: Extraocular movements intact.     Conjunctiva/sclera: Conjunctivae normal.     Pupils: Pupils are equal, round, and reactive to light.  Neck:     Thyroid: No thyroid mass or thyromegaly.     Vascular: Carotid bruit (referred from murmur) present.  Cardiovascular:     Rate and Rhythm: Normal rate and regular rhythm.     Pulses: Normal pulses.     Heart sounds: Murmur (2/6 systolic murmur USB) heard.  Pulmonary:     Effort: Pulmonary effort is normal. No respiratory distress.     Breath sounds: Normal breath sounds. No wheezing, rhonchi or rales.  Abdominal:     General: Bowel sounds are normal. There is no distension.     Palpations: Abdomen is soft. There is no mass.     Tenderness: There is no abdominal tenderness. There is no guarding or rebound.     Hernia: No hernia is present.  Musculoskeletal:     Cervical back: Normal range of  motion and neck supple. No rigidity.     Right lower leg: No edema.     Left lower leg: No edema.     Comments:  No pain midline spine No paraspinous mm tenderness Neg SLR bilaterally. No pain with int/ext rotation at hip. Neg FABER. No pain at SIJ, or sciatic notch bilaterally.  Discomfort to L>R greater trochanteric bursae Marked lordosis  Lymphadenopathy:     Cervical:  No cervical adenopathy.  Skin:    General: Skin is warm and dry.     Findings: No rash.  Neurological:     General: No focal deficit present.     Mental Status: She is alert. Mental status is at baseline.     Comments: 5/5 strength BLE  Psychiatric:        Mood and Affect: Mood normal.        Behavior: Behavior normal.       Results for orders placed or performed in visit on 05/27/22  Comprehensive metabolic panel  Result Value Ref Range   Sodium 138 135 - 145 mEq/L   Potassium 3.7 3.5 - 5.1 mEq/L   Chloride 99 96 - 112 mEq/L   CO2 30 19 - 32 mEq/L   Glucose, Bld 106 (H) 70 - 99 mg/dL   BUN 12 6 - 23 mg/dL   Creatinine, Ser 0.89 0.40 - 1.20 mg/dL   Total Bilirubin 0.5 0.2 - 1.2 mg/dL   Alkaline Phosphatase 62 39 - 117 U/L   AST 14 0 - 37 U/L   ALT 14 0 - 35 U/L   Total Protein 7.1 6.0 - 8.3 g/dL   Albumin 4.6 3.5 - 5.2 g/dL   GFR 65.27 >60.00 mL/min   Calcium 9.6 8.4 - 10.5 mg/dL  Lipid panel  Result Value Ref Range   Cholesterol 173 0 - 200 mg/dL   Triglycerides 123.0 0.0 - 149.0 mg/dL   HDL 56.20 >39.00 mg/dL   VLDL 24.6 0.0 - 40.0 mg/dL   LDL Cholesterol 92 0 - 99 mg/dL   Total CHOL/HDL Ratio 3    NonHDL 116.69   VITAMIN D 25 Hydroxy (Vit-D Deficiency, Fractures)  Result Value Ref Range   VITD 33.61 30.00 - 100.00 ng/mL  Vitamin B12  Result Value Ref Range   Vitamin B-12 415 211 - 911 pg/mL    Assessment & Plan:   Problem List Items Addressed This Visit       Unprioritized   Advanced care planning/counseling discussion (Chronic)    Advanced planning: In chart - HCPOA is husband  Richard then [place]. Would not want prolonged life support if terminal condition (10/2014).       Hypertension    Chronic, stable on current regimen - continue.       Relevant Medications   amLODipine (NORVASC) 5 MG tablet   hydrochlorothiazide (MICROZIDE) 12.5 MG capsule   pravastatin (PRAVACHOL) 40 MG tablet   Hyperlipidemia    Chronic, stable period on pravastatin - continue this. The 10-year ASCVD risk score (Arnett DK, et al., 2019) is: 14.2%   Values used to calculate the score:     Age: 20 years     Sex: Female     Is Non-Hispanic African American: No     Diabetic: No     Tobacco smoker: No     Systolic Blood Pressure: 174 mmHg     Is BP treated: Yes     HDL Cholesterol: 56.2 mg/dL     Total Cholesterol: 173 mg/dL       Relevant Medications   amLODipine (NORVASC) 5 MG tablet   hydrochlorothiazide (MICROZIDE) 12.5 MG capsule   pravastatin (PRAVACHOL) 40 MG tablet   Osteopenia    Vit D levels stable on current regimen.  Rpt DEXA 03/2023      Aortic stenosis, mild    Stable murmur, echo showed mild aortic sclerosis without stenosis.       Relevant Medications  amLODipine (NORVASC) 5 MG tablet   hydrochlorothiazide (MICROZIDE) 12.5 MG capsule   pravastatin (PRAVACHOL) 40 MG tablet   Alcohol dependence ([place])    Has significantly cut down on use, down to 1-2 glasses wine daily.       Low serum vitamin B12    Levels repleted on MWF dosing.       Anxiety    Notices increased anxiety in setting of husband's recent ER visit and cardiac workup for syncope. She was previously on lexapro with good results. She notes predominant anxiety difficulty, no depressed mood or anhedonia. She also notes anxiety around driving.  Reviewed possible treatment options. Reviewed possible side effects of buspar. Will start buspar 7.'5mg'$  nightly x 1 wk then increase to BID. Update with effect       Relevant Medications   busPIRone (BUSPAR) 7.5 MG tablet   Bilateral hip pain    L>R  pain present for the past several months in h/o frequent regular walking. Exam today not consistent with hip osteoarthritis but rather greater trochanteric pain. Discussed this, provided with handout on hip exercises, suggested sports med f/u if ongoing pain. Could alternatively consider course of PT. Also rec heating pad, tylenol/ibuprofen PRN pain.       Other Visit Diagnoses     Special screening for malignant neoplasms, colon    -  Primary   Relevant Orders   Cologuard        Meds ordered this encounter  Medications   amLODipine (NORVASC) 5 MG tablet    Sig: Take 1 tablet (5 mg total) by mouth daily.    Dispense:  90 tablet    Refill:  04   hydrochlorothiazide (MICROZIDE) 12.5 MG capsule    Sig: Take 1 capsule (12.5 mg total) by mouth daily.    Dispense:  90 capsule    Refill:  4   pravastatin (PRAVACHOL) 40 MG tablet    Sig: TAKE 1 TABLET(40 MG) BY MOUTH DAILY    Dispense:  90 tablet    Refill:  4   busPIRone (BUSPAR) 7.5 MG tablet    Sig: Take 1 tablet (7.5 mg total) by mouth 2 (two) times daily.    Dispense:  60 tablet    Refill:  4   Orders Placed This Encounter  Procedures   Cologuard    Patient instructions: We will sign you up for Cologuard.  For hip pain - I think you have hip bursitis or greater trochanteric pain syndrome. Try exercises provided today. If worsening, schedule appointment to see Dr Lorelei Pont sports medicine for evaluation.  For mood, try buspar 7.'5mg'$  twice daily, first week take 7.'5mg'$  at night.  Let us know how you do with this.  Return in 1 year for next wellness visit.   Follow up plan: Return in about 1 year (around 06/07/2023) for medicare wellness visit.  Ria Bush, MD

## 2022-07-05 ENCOUNTER — Encounter: Payer: Self-pay | Admitting: Family Medicine

## 2022-07-07 NOTE — Telephone Encounter (Signed)
Updated pt's chart.  

## 2022-07-08 DIAGNOSIS — Z1283 Encounter for screening for malignant neoplasm of skin: Secondary | ICD-10-CM | POA: Diagnosis not present

## 2022-07-08 DIAGNOSIS — L821 Other seborrheic keratosis: Secondary | ICD-10-CM | POA: Diagnosis not present

## 2022-07-10 ENCOUNTER — Ambulatory Visit
Admission: RE | Admit: 2022-07-10 | Discharge: 2022-07-10 | Disposition: A | Discharge status: Home / Self Care | Payer: Medicare Other | Source: Ambulatory Visit

## 2022-07-10 DIAGNOSIS — Z1231 Encounter for screening mammogram for malignant neoplasm of breast: Secondary | ICD-10-CM

## 2022-07-28 ENCOUNTER — Encounter: Payer: Self-pay | Admitting: Family Medicine

## 2022-07-28 DIAGNOSIS — Z1211 Encounter for screening for malignant neoplasm of colon: Secondary | ICD-10-CM | POA: Diagnosis not present

## 2022-07-29 MED ORDER — SCOPOLAMINE 1 MG/3DAYS TD PT72: 1.0000 | MEDICATED_PATCH | TRANSDERMAL | 0 refills | Status: DC

## 2022-08-08 LAB — COLOGUARD: COLOGUARD: NEGATIVE

## 2022-09-18 ENCOUNTER — Encounter: Payer: Self-pay | Admitting: Family Medicine

## 2022-09-19 ENCOUNTER — Ambulatory Visit (INDEPENDENT_AMBULATORY_CARE_PROVIDER_SITE_OTHER): Payer: Medicare Other | Admitting: Family Medicine

## 2022-09-19 VITALS — BP 118/76 | HR 67 | Temp 97.5°F | Ht 65.7 in | Wt 140.2 lb

## 2022-09-19 DIAGNOSIS — H905 Unspecified sensorineural hearing loss: Secondary | ICD-10-CM | POA: Insufficient documentation

## 2022-09-19 DIAGNOSIS — H9193 Unspecified hearing loss, bilateral: Secondary | ICD-10-CM | POA: Insufficient documentation

## 2022-09-19 NOTE — Assessment & Plan Note (Addendum)
Reassuring hearing screen in office today.  However she notes progressive hearing difficulty especially with voice discrimination in noisy room.  Will refer to audiology for further evaluation in [place].

## 2022-09-19 NOTE — Progress Notes (Signed)
Patient ID: Sheri Patel, female    DOB: 06/11/1951, 73 y.o.   MRN: [id number]  This visit was conducted in person.  BP 118/76   Pulse 67   Temp (!) 97.5 F (36.4 C) (Temporal)   Ht 5' 5.7" (1.669 m)   Wt 140 lb 4 oz (63.6 kg)   SpO2 96%   BMI 22.84 kg/m    Hearing Screening   500Hz  1000Hz  2000Hz  4000Hz   Right ear 25 25 25 25   Left ear 40 25 20 40    CC: discuss hearing loss Subjective:   HPI: Sheri Patel is a 72 y.o. female presenting on 09/19/2022 for Hearing Problem (C/o worsening hearing loss. Wants to discuss audiology referral for possible hearing aids. )   Recent trip to [place]. She really enjoyed this.   Notes progressive hearing loss, but worsening to the point of having to repeatedly question, not having as many conversations wit when going out to eat at restaurant.   Ongoing issue for years, progressively worse.   No tinnitus, ear pain, muffled hearing or fullness sensation.  Requests [place] referral.      Relevant past medical, surgical, family and social history reviewed and updated as indicated. Interim medical history since our last visit reviewed. Allergies and medications reviewed and updated. Outpatient Medications Prior to Visit  Medication Sig Dispense Refill   amLODipine (NORVASC) 5 MG tablet Take 1 tablet (5 mg total) by mouth daily. 90 tablet 04   aspirin EC 81 MG tablet Take 1 tablet (81 mg total) by mouth once a week. Swallow whole.     busPIRone (BUSPAR) 7.5 MG tablet Take 1 tablet (7.5 mg total) by mouth 2 (two) times daily. 60 tablet 4   Calcium-Vitamin D-Vitamin K (CALCIUM + D + K PO) Take 1 tablet by mouth daily.     Cyanocobalamin (B-12) 1000 MCG SUBL Place 1 tablet under the tongue every Monday, Wednesday, and Friday. 30 tablet    cycloSPORINE (RESTASIS) 0.05 % ophthalmic emulsion Place 1 drop into both eyes 2 (two) times daily.     hydrochlorothiazide (MICROZIDE) 12.5 MG capsule Take 1 capsule (12.5 mg total) by mouth daily. 90  capsule 4   pravastatin (PRAVACHOL) 40 MG tablet TAKE 1 TABLET(40 MG) BY MOUTH DAILY 90 tablet 4   psyllium (METAMUCIL SMOOTH TEXTURE) 58.6 % powder Take 1 packet by mouth daily.     scopolamine (TRANSDERM-SCOP) 1 MG/3DAYS Place 1 patch (1.5 mg total) onto the skin every 3 (three) days. 10 patch 0   No facility-administered medications prior to visit.     Per HPI unless specifically indicated in ROS section below Review of Systems  Objective:  BP 118/76   Pulse 67   Temp (!) 97.5 F (36.4 C) (Temporal)   Ht 5' 5.7" (1.669 m)   Wt 140 lb 4 oz (63.6 kg)   SpO2 96%   BMI 22.84 kg/m   Wt Readings from Last 3 Encounters:  09/19/22 140 lb 4 oz (63.6 kg)  06/06/22 142 lb 3.2 oz (64.5 kg)  05/29/22 139 lb (63 kg)      Physical Exam Vitals and nursing note reviewed.  Constitutional:      Appearance: Normal appearance. She is not ill-appearing.  HENT:     Head: Normocephalic and atraumatic.     Right Ear: Tympanic membrane, ear canal and external ear normal. There is no impacted cerumen.     Left Ear: Tympanic membrane, ear canal and external ear normal.  There is no impacted cerumen.     Ears:     Comments: Ears clear Neurological:     Mental Status: She is alert.     Comments: Normal rinne (AC > BC) and weber test bilaterally  Psychiatric:        Mood and Affect: Mood normal.        Behavior: Behavior normal.       Results for orders placed or performed in visit on 06/06/22  Cologuard  Result Value Ref Range   COLOGUARD Negative Negative   Assessment & Plan:   Problem List Items Addressed This Visit     Bilateral hearing loss - Primary    Reassuring hearing screen in office today.  However she notes progressive hearing difficulty especially with voice discrimination in noisy room.  Will refer to audiology for further evaluation in [place].       Relevant Orders   Ambulatory referral to Audiology     No orders of the defined types were placed in this  encounter.   Orders Placed This Encounter  Procedures   Ambulatory referral to Audiology    Referral Priority:   Routine    Referral Type:   Audiology Exam    Referral Reason:   Specialty Services Required    Number of Visits Requested:   1    Patient Instructions  Good to see you today We will refer you to audiologist in [place] for formal hearing evaluation.   Follow up plan: No follow-ups on file.  Ria Bush, MD

## 2022-09-19 NOTE — Telephone Encounter (Signed)
Spoke with pt scheduling OV today at 3:00.

## 2022-09-19 NOTE — Patient Instructions (Addendum)
Good to see you today We will refer you to audiologist in  for formal hearing evaluation.

## 2022-09-20 NOTE — Addendum Note (Signed)
Addended by: Ria Bush on: 09/20/2022 10:53 AM   Modules accepted: Level of Service

## 2022-10-07 ENCOUNTER — Ambulatory Visit: Payer: Medicare Other | Attending: Audiologist | Admitting: Audiologist

## 2022-10-07 DIAGNOSIS — H903 Sensorineural hearing loss, bilateral: Secondary | ICD-10-CM | POA: Diagnosis not present

## 2022-10-07 NOTE — Procedures (Signed)
  Outpatient Audiology and [facility] [address] [place], [place]  [address] [phone]  AUDIOLOGICAL  EVALUATION  NAME: Sheri Patel     DOB:   1951-02-12      MRN: [id number]                                                                                     DATE: 10/07/2022     REFERENT: Eustaquio Boyden, MD STATUS: Outpatient DIAGNOSIS: Sensorineural Hearing Loss Bilateral    History: Sheri Patel was seen for an audiological evaluation.  Sheri Patel is receiving a hearing evaluation due to concerns for progressive hearing loss. Sheri Patel has difficulty hearing in background noise like restaurants. This difficulty began gradually. No pain or pressure reported in either ear. Tinnitus denied for both ears. Sheri Patel has no significant noise exposure history.  Medical history negative for a condition which is a risk factor for hearing loss. No other relevant case history reported.   Evaluation:  Otoscopy showed a clear view of the tympanic membranes, bilaterally Tympanometry results were consistent with normal middle ear function, bilaterally   Audiometric testing was completed using conventional audiometry with supraural transducer. Speech Recognition Thresholds were 20dB in the right ear and 15dB in the left ear. Word Recognition was performed  40dB SL, scored 100% in the right ear and 100% in the left ear. Pure tone thresholds show normal sloping to mild sensorineural hearing loss in both ears.  Quick Speech in Noise Test (QuickSIN):  list of six sentences with five key words per sentence is presented in four-talker babble noise. The sentences are presented at pre- recorded signal-to-noise ratios which decrease in 5-dB steps from 25 (very easy) to 0 (extremely difficult). The SNRs used are: 25, 20, 15, 10, 5 and 0, encompassing normal to severely impaired performance in noise. Taxes binaural separation and discrimination skills. Sheri Patel scored 2.5dB SNR loss which is in normal  range.    Results:  The test results were reviewed with Sheri Patel. She has a mild sensorineural hearing loss in each ear. This will impact speech understanding when people are communicating from a distance or in noise. She still has good ability to understand speech in noise, but it will take more effort. She can try hearing aids if she feels she will wear them daily.  Recommendations: Amplification could be beneficial with regular use for both ears. Hearing aids can be purchased from a variety of locations. Sheri Patel has a mild loss and can try OTC hearing aids or be fit by audiologist.     26 minutes spent testing and counseling on results.   Ammie Ferrier  Audiologist, Au.D., CCC-A 10/07/2022  9:59 AM  Cc: Eustaquio Boyden, MD

## 2023-03-25 ENCOUNTER — Encounter: Payer: Self-pay | Admitting: Family Medicine

## 2023-03-25 DIAGNOSIS — H18593 Other hereditary corneal dystrophies, bilateral: Secondary | ICD-10-CM | POA: Diagnosis not present

## 2023-03-25 DIAGNOSIS — H43813 Vitreous degeneration, bilateral: Secondary | ICD-10-CM | POA: Diagnosis not present

## 2023-03-25 DIAGNOSIS — D3132 Benign neoplasm of left choroid: Secondary | ICD-10-CM | POA: Diagnosis not present

## 2023-03-25 DIAGNOSIS — H2513 Age-related nuclear cataract, bilateral: Secondary | ICD-10-CM | POA: Diagnosis not present

## 2023-04-22 DIAGNOSIS — Z23 Encounter for immunization: Secondary | ICD-10-CM | POA: Diagnosis not present

## 2023-05-16 ENCOUNTER — Encounter: Payer: Self-pay | Admitting: Family Medicine

## 2023-06-15 ENCOUNTER — Ambulatory Visit (INDEPENDENT_AMBULATORY_CARE_PROVIDER_SITE_OTHER): Payer: Medicare Other

## 2023-06-15 VITALS — Ht 65.7 in | Wt 139.0 lb

## 2023-06-15 DIAGNOSIS — Z0000 Encounter for general adult medical examination without abnormal findings: Secondary | ICD-10-CM

## 2023-06-15 DIAGNOSIS — Z1231 Encounter for screening mammogram for malignant neoplasm of breast: Secondary | ICD-10-CM | POA: Diagnosis not present

## 2023-06-15 NOTE — Progress Notes (Signed)
Subjective:   Sheri Patel is a 72 y.o. female who presents for Medicare Annual (Subsequent) preventive examination.  Visit Complete: Virtual I connected with  Deseree Jarmin Crays on 06/15/23 by a audio enabled telemedicine application and verified that I am speaking with the correct person using two identifiers.  Patient Location: Home  Provider Location: Office/Clinic  I discussed the limitations of evaluation and management by telemedicine. The patient expressed understanding and agreed to proceed.  Vital Signs: Because this visit was a virtual/telehealth visit, some criteria may be missing or patient reported. Any vitals not documented were not able to be obtained and vitals that have been documented are patient reported.  Patient Medicare AWV questionnaire was completed by the patient on 06/14/23; I have confirmed that all information answered by patient is correct and no changes since this date. Cardiac Risk Factors include: advanced age (>33men, >68 women);dyslipidemia;hypertension    Objective:    Today's Vitals   06/14/23 0753 06/15/23 1259  Weight:  139 lb (63 kg)  Height:  5' 5.7" (1.669 m)  PainSc: 2     Body mass index is 22.64 kg/m.     06/15/2023    1:06 PM 05/29/2022    3:09 PM 05/16/2020    3:34 PM 02/04/2018   10:43 AM  Advanced Directives  Does Patient Have a Medical Advance Directive? Yes No Yes Yes  Type of Estate agent of [place];Living will  Healthcare Power of [place];Living will Healthcare Power of [place];Living will  Copy of Healthcare Power of Attorney in Chart? No - copy requested  No - copy requested Yes  Would patient like information on creating a medical advance directive?  No - Patient declined      Current Medications (verified) Outpatient Encounter Medications as of 06/15/2023  Medication Sig   amLODipine (NORVASC) 5 MG tablet Take 1 tablet (5 mg total) by mouth daily.   aspirin EC 81 MG tablet Take 1  tablet (81 mg total) by mouth once a week. Swallow whole.   Calcium-Vitamin D-Vitamin K (CALCIUM + D + K PO) Take 1 tablet by mouth daily.   Cyanocobalamin (B-12) 1000 MCG SUBL Place 1 tablet under the tongue every 07/15/2023, Wednesday, and Friday.   cycloSPORINE (RESTASIS) 0.05 % ophthalmic emulsion Place 1 drop into both eyes 2 (two) times daily.   hydrochlorothiazide (MICROZIDE) 12.5 MG capsule Take 1 capsule (12.5 mg total) by mouth daily.   pravastatin (PRAVACHOL) 40 MG tablet TAKE 1 TABLET(40 MG) BY MOUTH DAILY   psyllium (METAMUCIL SMOOTH TEXTURE) 58.6 % powder Take 1 packet by mouth daily.   busPIRone (BUSPAR) 7.5 MG tablet Take 1 tablet (7.5 mg total) by mouth 2 (two) times daily.   No facility-administered encounter medications on file as of 06/15/2023.    Allergies (verified) Patient has no known allergies.   History: Past Medical History:  Diagnosis Date   Arthritis    hands, off meds   Atrophic vaginitis 08/2014   by prior OBGYN   History of migraine    resolved after menopause   Hyperlipidemia    Hypertension    Osteopenia 09/2012   DEXA T-1.9 at spine, consider rpt in 3-5 yrs   Situational depression Jul 14, 2010   after brother died (lung cancer)   Ventricular ectopy    benign, s/p cards eval   Past Surgical History:  Procedure Laterality Date   COLONOSCOPY  12/2011   normal ([facility] in [place])   DEXA  09/2012   T -1.9  at spine, -0.7 at hip   TONSILLECTOMY  1966   Family History  Problem Relation Age of Onset   Alcohol abuse Mother    Arthritis Mother    Hyperlipidemia Mother    CAD Mother 53       MI   Hypertension Mother    Hyperlipidemia Father    CAD Father 72       MI   Alcohol abuse Brother    Cancer Brother        lung (smoker)   CAD Brother 21       MI   Hyperlipidemia Brother    Hypertension Brother    Mental illness Brother    Diabetes Brother    Hypertension Sister    Arthritis Maternal Grandmother    Stroke Neg Hx    Social History    Socioeconomic History   Marital status: Married    Spouse name: Not on file   Number of children: Not on file   Years of education: Not on file   Highest education level: Master's degree (e.g., MA, MS, MEng, MEd, MSW, MBA)  Occupational History   Not on file  Tobacco Use   Smoking status: Former   Smokeless tobacco: Never  Vaping Use   Vaping status: Never Used  Substance and Sexual Activity   Alcohol use: Yes    Alcohol/week: 7.0 - 14.0 standard drinks of alcohol    Types: 7 - 14 Glasses of wine per week    Comment: 1-2 glasses of wine a day    Drug use: No   Sexual activity: Not on file  Other Topics Concern   Not on file  Social History Narrative   Lives with husband, no pets   [place]   Occupation: retired, Nurse, 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[place])   Activity[place]    Diet: good water, fruits/vegetables daily      Derm: Development worker, community    Ophtho: [place]   Social Drivers of Corporate investment banker Strain: Low Risk  (06/14/2023)   Overall Financial Resource Strain (CARDIA)    Difficulty of Paying Living Expenses: Not hard at all  Food Insecurity: No Food Insecurity (06/14/2023)   Hunger Vital Sign    Worried About Running Out of Food in the Last Year: Never true    Ran Out of Food in the Last Year: Never true  Transportation Needs: No Transportation Needs (06/14/2023)   PRAPARE - Administrator, Civil Service (Medical): No    Lack of Transportation (Non-Medical): No  Physical Activity: Sufficiently Active (06/14/2023)   Exercise Vital Sign    Days of Exercise per Week: 5 days    Minutes of Exercise per Session: 30 min  Stress: Stress Concern Present (06/14/2023)   [employer] of Occupational Health - Occupational Stress Questionnaire    Feeling of Stress : Rather much  Social Connections: Socially Integrated (06/14/2023)   Social Connection and Isolation Panel [NHANES]    Frequency of Communication with Friends and  Family: More than three times a week    Frequency of Social Gatherings with Friends and Family: Once a week    Attends Religious Services: More than 4 times per year    Active Member of [employer] or Organizations: Yes    Attends Engineer, structural: More than 4 times per year    Marital Status: Married    Tobacco Counseling Counseling given: Not Answered   Clinical Intake:  Consulting civil engineer  completed: Yes  Pain : 0-10 Pain Score: 2  Pain Type: Chronic pain Pain Location: Hip (both) Pain Descriptors / Indicators: Aching Pain Onset: More than a month ago Pain Frequency: Intermittent Pain Relieving Factors: stretvhing exercises, Tylenol occassionally  Pain Relieving Factors: stretvhing exercises, Tylenol occassionally  BMI - recorded: 22.64 Nutritional Risks: None Diabetes: No  How often do you need to have someone help you when you read instructions, pamphlets, or other written materials from your doctor or pharmacy?: 1 - Never  Interpreter Needed?: No  Comments: lives with husband Information entered by :: B.Pihu Basil,LPN   Activities of Daily Living    06/14/2023    7:53 AM  In your present state of health, do you have any difficulty performing the following activities:  Hearing? 0  Vision? 1  Difficulty concentrating or making decisions? 0  Walking or climbing stairs? 0  Dressing or bathing? 0  Doing errands, shopping? 0  Preparing Food and eating ? N  Using the Toilet? N  In the past six months, have you accidently leaked urine? N  Do you have problems with loss of bowel control? N  Managing your Medications? N  Managing your Finances? N  Housekeeping or managing your Housekeeping? N    Patient Care Team: Eustaquio Boyden, MD as PCP - General (Family Medicine)  Indicate any recent Medical Services you may have received from other than [facility] providers in the past year (date may be approximate).     Assessment:   This is a routine wellness  examination for [place].  Hearing/Vision screen Hearing Screening - Comments:: Pt says her hearing is alright;slight hearing per ENT Vision Screening - Comments:: Pt says her vision is good;cataracts forming Dr Dione Booze   Goals Addressed             This Visit's Progress    COMPLETED: DIET - EAT MORE FRUITS AND VEGETABLES   On track    COMPLETED: Patient Stated   On track    Starting 02/04/2018, I will continue to walk for 30 minutes daily and to participate in Silver Sneakers for 60 minutes 3 days per week.      COMPLETED: Patient Stated   On track    05/16/2020, I will continue to do yoga at the [facility] 4 days a week for 50 minutes.        Depression Screen    06/15/2023    1:04 PM 09/19/2022    3:02 PM 05/29/2022    3:07 PM 05/28/2021    2:18 PM 05/16/2020    3:35 PM 09/13/2019   12:23 PM 04/11/2019    3:57 PM  PHQ 2/9 Scores  PHQ - 2 Score 0 0 0 0 0 0 2  PHQ- 9 Score  3 0  0 4 12    Fall Risk    06/14/2023    7:53 AM 09/19/2022    3:02 PM 05/29/2022    3:09 PM 05/28/2021    2:18 PM 05/16/2020    3:35 PM  Fall Risk   Falls in the past year? 0 0 0 0 0  Number falls in past yr: 0  0  0  Injury with Fall? 0  0  0  Risk for fall due to : No Fall Risks  No Fall Risks  Medication side effect  Follow up Education provided;Falls prevention discussed  Falls prevention discussed;Falls evaluation completed  Falls evaluation completed;Falls prevention discussed    MEDICARE RISK AT HOME: Medicare Risk at Home Any  stairs in or around the home?: No If so, are there any without handrails?: No Home free of loose throw rugs in walkways, pet beds, electrical cords, etc?: Yes Adequate lighting in your home to reduce risk of falls?: Yes Life alert?: No Use of a cane, walker or w/c?: No Grab bars in the bathroom?: Yes Shower chair or bench in shower?: No Elevated toilet seat or a handicapped toilet?: No  TIMED UP AND GO:  Was the test performed?  No    Cognitive Function:     05/16/2020    3:37 PM 02/04/2018   10:42 AM  MMSE - Mini Mental State Exam  Orientation to time 5 5  Orientation to Place 5 5  Registration 3 3  Attention/ Calculation 5 0  Recall 3 3  Language- name 2 objects  0  Language- repeat 1 1  Language- follow 3 step command  3  Language- read & follow direction  0  Write a sentence  0  Copy design  0  Total score  20        06/15/2023    1:10 PM 05/29/2022    3:10 PM  6CIT Screen  What Year? 0 points 0 points  What month? 0 points 0 points  What time? 0 points 0 points  Count back from 20 0 points 0 points  Months in reverse 0 points 0 points  Repeat phrase 0 points 0 points  Total Score 0 points 0 points    Immunizations Immunization History  Administered Date(s) Administered   Fluad Quad(high Dose 65+) 04/25/2022   Influenza, High Dose Seasonal PF 04/16/2017, 05/15/2018, 02/26/2019, 05/06/2020, 05/15/2021   Influenza,inj,Quad PF,6+ Mos 05/04/2014   Influenza-Unspecified 06/07/2015, 05/13/2023   PFIZER Comirnaty(Gray Top)Covid-19 Tri-Sucrose Vaccine 04/25/2022   PFIZER(Purple Top)SARS-COV-2 Vaccination 07/19/2019, 08/09/2019, 04/06/2020, 10/05/2020   Pfizer Covid-19 Vaccine Bivalent Booster 11yrs & up 05/15/2021   Pfizer(Comirnaty)Fall Seasonal Vaccine 12 years and older 03/01/2023   Pneumococcal Conjugate-13 10/22/2016   Pneumococcal Polysaccharide-23 02/04/2018   Rsv, Bivalent, Protein Subunit Rsvpref,pf Verdis Frederickson) 06/03/2022   Td 06/30/2008, 05/04/2019   Zoster Recombinant(Shingrix) 03/20/2018, 07/19/2018   Zoster, Live 06/30/2010    TDAP status: Up to date  Flu Vaccine status: Up to date  Pneumococcal vaccine status: Up to date  Covid-19 vaccine status: Completed vaccines  Qualifies for Shingles Vaccine? Yes   Zostavax completed Yes   Shingrix Completed?: Yes  Screening Tests Health Maintenance  Topic Date Due   MAMMOGRAM  07/11/2023   Medicare Annual Wellness (AWV)  06/14/2024   Fecal DNA (Cologuard)   07/28/2025   DTaP/Tdap/Td (3 - Tdap) 05/03/2029   Pneumonia Vaccine 54+ Years old  Completed   INFLUENZA VACCINE  Completed   DEXA SCAN  Completed   COVID-19 Vaccine  Completed   Hepatitis C Screening  Completed   Zoster Vaccines- Shingrix  Completed   HPV VACCINES  Aged Out   Colonoscopy  Discontinued    Health Maintenance  There are no preventive care reminders to display for this patient.   Colorectal cancer screening: Type of screening: Cologuard. Completed 01/09/2012. Repeat every 10 years DISCONTINUED per chart  Mammogram status: Ordered yes. Pt provided with contact info and advised to call to schedule appt.   Bone Density status: Completed 04/13/2018. Results reflect: Bone density results: OSTEOPENIA. Repeat every 5-10 years.  Lung Cancer Screening: (Low Dose CT Chest recommended if Age 42-80 years, 20 pack-year currently smoking OR have quit w/in 15years.) does not qualify.   Lung Cancer Screening  Referral: no  Additional Screening:  Hepatitis C Screening: does not qualify; Completed 10/16/2016  Vision Screening: Recommended annual ophthalmology exams for early detection of glaucoma and other disorders of the eye. Is the patient up to date with their annual eye exam?  Yes  Who is the provider or what is the name of the office in which the patient attends annual eye exams? Dr Zetta Bills If pt is not established with a provider, would they like to be referred to a provider to establish care? No .   Dental Screening: Recommended annual dental exams for proper oral hygiene  Diabetic Foot Exam: n/a  Community Resource Referral / Chronic Care Management: CRR required this visit?  No   CCM required this visit?  No     Plan:     I have personally reviewed and noted the following in the patient's chart:   Medical and social history Use of alcohol, tobacco or illicit drugs  Current medications and supplements including opioid prescriptions. Patient is not currently  taking opioid prescriptions. Functional ability and status Nutritional status Physical activity Advanced directives List of other physicians Hospitalizations, surgeries, and ER visits in previous 12 months Vitals Screenings to include cognitive, depression, and falls Referrals and appointments  In addition, I have reviewed and discussed with patient certain preventive protocols, quality metrics, and best practice recommendations. A written personalized care plan for preventive services as well as general preventive health recommendations were provided to patient.    Sue Lush, LPN   [id number]   After Visit Summary: (MyChart) Due to this being a telephonic visit, the after visit summary with patients personalized plan was offered to patient via MyChart   Nurse Notes: The patient states she is doing well and has no concerns or questions at this time.

## 2023-06-15 NOTE — Patient Instructions (Signed)
Sheri Patel , Thank you for taking time to come for your Medicare Wellness Visit. I appreciate your ongoing commitment to your health goals. Please review the following plan we discussed and let me know if I can assist you in the future.   Referrals/Orders/Follow-Ups/Clinician Recommendations:   You have an order for:  []   2D Mammogram  [x]   3D Mammogram  []   Bone Density     Please call for appointment:  [facility] [address] [place], [place] [address] [phone]  [facility] [address] [place], [place] [address] [phone]  [facility] [place] [address] [place], [place] [address] [phone]  [facility] [place] [facility] D[facility] [address] [place], [place] [address] [phone]  [facility] [address] [place], [place] [address] [phone]    Make sure to wear two-piece clothing.  No lotions, powders, or deodorants the day of the appointment. Make sure to bring picture ID and insurance card.  Bring list of medications you are currently taking including any supplements.   Schedule your [place] screening mammogram through MyChart!   Log into your MyChart account.  Go to 'Visit' (or 'Appointments' if on mobile App) --> Schedule an Appointment  Under 'Select a Reason for Visit' choose the Mammogram Screening option.  Complete the pre-visit questions and select the time and place that best fits your schedule.    This is a list of the screening recommended for you and due dates:  Health Maintenance  Topic Date Due   Flu Shot  01/29/2023   Mammogram  07/11/2023   Medicare Annual Wellness Visit  06/14/2024   Cologuard (Stool DNA test)  07/28/2025   DTaP/Tdap/Td vaccine (3 - Tdap) 05/03/2029   Pneumonia Vaccine  Completed   DEXA scan (bone density measurement)  Completed   COVID-19 Vaccine  Completed   Hepatitis C Screening   Completed   Zoster (Shingles) Vaccine  Completed   HPV Vaccine  Aged Out   Colon Cancer Screening  Discontinued    Advanced directives: (Copy Requested) Please bring a copy of your health care power of attorney and living will to the office to be added to your chart at your convenience.  Next Medicare Annual Wellness Visit scheduled for next year: Yes 06/15/24 @ 1pm telephone

## 2023-07-01 ENCOUNTER — Other Ambulatory Visit: Payer: Self-pay | Admitting: Medical Genetics

## 2023-07-05 ENCOUNTER — Encounter: Payer: Self-pay | Admitting: Family Medicine

## 2023-07-06 MED ORDER — HYDROCHLOROTHIAZIDE 12.5 MG PO CAPS: 12.5000 mg | ORAL_CAPSULE | Freq: Every day | ORAL | 0 refills | Status: DC

## 2023-07-06 MED ORDER — PRAVASTATIN SODIUM 40 MG PO TABS: ORAL_TABLET | ORAL | 0 refills | Status: DC

## 2023-07-06 MED ORDER — AMLODIPINE BESYLATE 5 MG PO TABS: 5.0000 mg | ORAL_TABLET | Freq: Every day | ORAL | 0 refills | Status: DC

## 2023-07-08 ENCOUNTER — Other Ambulatory Visit: Payer: Self-pay | Admitting: Family Medicine

## 2023-07-08 DIAGNOSIS — Z1231 Encounter for screening mammogram for malignant neoplasm of breast: Secondary | ICD-10-CM

## 2023-07-10 ENCOUNTER — Other Ambulatory Visit (HOSPITAL_COMMUNITY): Payer: Self-pay

## 2023-07-14 DIAGNOSIS — L820 Inflamed seborrheic keratosis: Secondary | ICD-10-CM | POA: Diagnosis not present

## 2023-07-14 DIAGNOSIS — D225 Melanocytic nevi of trunk: Secondary | ICD-10-CM | POA: Diagnosis not present

## 2023-07-14 DIAGNOSIS — Z1283 Encounter for screening for malignant neoplasm of skin: Secondary | ICD-10-CM | POA: Diagnosis not present

## 2023-07-16 ENCOUNTER — Ambulatory Visit
Admission: RE | Admit: 2023-07-16 | Discharge: 2023-07-16 | Disposition: A | Discharge status: Home / Self Care | Payer: Medicare Other | Source: Ambulatory Visit

## 2023-07-16 DIAGNOSIS — Z1231 Encounter for screening mammogram for malignant neoplasm of breast: Secondary | ICD-10-CM | POA: Diagnosis not present

## 2023-07-18 ENCOUNTER — Other Ambulatory Visit: Payer: Self-pay | Admitting: Family Medicine

## 2023-07-18 DIAGNOSIS — E782 Mixed hyperlipidemia: Secondary | ICD-10-CM

## 2023-07-18 DIAGNOSIS — Z87898 Personal history of other specified conditions: Secondary | ICD-10-CM

## 2023-07-18 DIAGNOSIS — M8588 Other specified disorders of bone density and structure, other site: Secondary | ICD-10-CM

## 2023-07-18 DIAGNOSIS — E538 Deficiency of other specified B group vitamins: Secondary | ICD-10-CM

## 2023-07-18 DIAGNOSIS — I10 Essential (primary) hypertension: Secondary | ICD-10-CM

## 2023-07-21 ENCOUNTER — Other Ambulatory Visit (INDEPENDENT_AMBULATORY_CARE_PROVIDER_SITE_OTHER): Payer: Medicare Other

## 2023-07-21 DIAGNOSIS — Z87898 Personal history of other specified conditions: Secondary | ICD-10-CM | POA: Diagnosis not present

## 2023-07-21 DIAGNOSIS — I10 Essential (primary) hypertension: Secondary | ICD-10-CM

## 2023-07-21 DIAGNOSIS — M8588 Other specified disorders of bone density and structure, other site: Secondary | ICD-10-CM

## 2023-07-21 DIAGNOSIS — E538 Deficiency of other specified B group vitamins: Secondary | ICD-10-CM | POA: Diagnosis not present

## 2023-07-21 DIAGNOSIS — E782 Mixed hyperlipidemia: Secondary | ICD-10-CM | POA: Diagnosis not present

## 2023-07-21 LAB — LIPID PANEL
Cholesterol: 189 mg/dL (ref 0–200)
HDL: 57.3 mg/dL (ref >39.00)
LDL Cholesterol: 108 mg/dL — ABNORMAL HIGH (ref 0–99)
NonHDL: 131.84
Total CHOL/HDL Ratio: 3
Triglycerides: 120 mg/dL (ref 0.0–149.0)
VLDL: 24 mg/dL (ref 0.0–40.0)

## 2023-07-21 LAB — COMPREHENSIVE METABOLIC PANEL
ALT: 18 U/L (ref 0–35)
AST: 16 U/L (ref 0–37)
Albumin: 4.6 g/dL (ref 3.5–5.2)
Alkaline Phosphatase: 53 U/L (ref 39–117)
BUN: 18 mg/dL (ref 6–23)
CO2: 32 meq/L (ref 19–32)
Calcium: 9.3 mg/dL (ref 8.4–10.5)
Chloride: 99 meq/L (ref 96–112)
Creatinine, Ser: 0.75 mg/dL (ref 0.40–1.20)
GFR: 79.51 mL/min (ref >60.00)
Glucose, Bld: 107 mg/dL — ABNORMAL HIGH (ref 70–99)
Potassium: 3.5 meq/L (ref 3.5–5.1)
Sodium: 139 meq/L (ref 135–145)
Total Bilirubin: 0.6 mg/dL (ref 0.2–1.2)
Total Protein: 7 g/dL (ref 6.0–8.3)

## 2023-07-21 LAB — VITAMIN B12: Vitamin B-12: 425 pg/mL (ref 211–911)

## 2023-07-21 LAB — MICROALBUMIN / CREATININE URINE RATIO
Creatinine,U: 65.6 mg/dL
Microalb Creat Ratio: 1.1 mg/g (ref 0.0–30.0)
Microalb, Ur: 0.7 mg/dL (ref 0.0–1.9)

## 2023-07-21 LAB — FOLATE: Folate: 21.8 ng/mL (ref >5.9)

## 2023-07-21 LAB — VITAMIN D 25 HYDROXY (VIT D DEFICIENCY, FRACTURES): VITD: 30.51 ng/mL (ref 30.00–100.00)

## 2023-07-27 LAB — VITAMIN B1: Vitamin B1 (Thiamine): 16 nmol/L (ref 8–30)

## 2023-07-28 ENCOUNTER — Encounter: Payer: Self-pay | Admitting: Family Medicine

## 2023-07-28 ENCOUNTER — Ambulatory Visit (INDEPENDENT_AMBULATORY_CARE_PROVIDER_SITE_OTHER): Payer: Medicare Other | Admitting: Family Medicine

## 2023-07-28 VITALS — BP 132/80 | HR 75 | Temp 98.5°F | Ht 65.75 in | Wt 140.4 lb

## 2023-07-28 DIAGNOSIS — E782 Mixed hyperlipidemia: Secondary | ICD-10-CM | POA: Diagnosis not present

## 2023-07-28 DIAGNOSIS — H905 Unspecified sensorineural hearing loss: Secondary | ICD-10-CM

## 2023-07-28 DIAGNOSIS — E538 Deficiency of other specified B group vitamins: Secondary | ICD-10-CM

## 2023-07-28 DIAGNOSIS — I10 Essential (primary) hypertension: Secondary | ICD-10-CM

## 2023-07-28 DIAGNOSIS — F1021 Alcohol dependence, in remission: Secondary | ICD-10-CM

## 2023-07-28 DIAGNOSIS — G4700 Insomnia, unspecified: Secondary | ICD-10-CM | POA: Insufficient documentation

## 2023-07-28 DIAGNOSIS — Z7189 Other specified counseling: Secondary | ICD-10-CM

## 2023-07-28 DIAGNOSIS — M8588 Other specified disorders of bone density and structure, other site: Secondary | ICD-10-CM | POA: Diagnosis not present

## 2023-07-28 DIAGNOSIS — I350 Nonrheumatic aortic (valve) stenosis: Secondary | ICD-10-CM

## 2023-07-28 DIAGNOSIS — F411 Generalized anxiety disorder: Secondary | ICD-10-CM

## 2023-07-28 MED ORDER — ESCITALOPRAM OXALATE 10 MG PO TABS: 10.0000 mg | ORAL_TABLET | Freq: Every day | ORAL | 4 refills | Status: AC

## 2023-07-28 MED ORDER — PRAVASTATIN SODIUM 40 MG PO TABS: ORAL_TABLET | ORAL | 4 refills | Status: AC

## 2023-07-28 MED ORDER — AMLODIPINE BESYLATE 5 MG PO TABS: 5.0000 mg | ORAL_TABLET | Freq: Every day | ORAL | 4 refills | Status: AC

## 2023-07-28 MED ORDER — HYDROCHLOROTHIAZIDE 12.5 MG PO CAPS: 12.5000 mg | ORAL_CAPSULE | Freq: Every day | ORAL | 4 refills | Status: AC

## 2023-07-28 NOTE — Assessment & Plan Note (Signed)
Levels stable on MWF dosing

## 2023-07-28 NOTE — Assessment & Plan Note (Signed)
Continued social drinking.  Limits to 1-2 glasses wine/day.

## 2023-07-28 NOTE — Progress Notes (Signed)
Ph: [phone] Fax: [phone]   Patient ID: Sheri Patel, female    DOB: 24-Mar-1951, 73 y.o.   MRN: [id number]  This visit was conducted in person.  BP 132/80   Pulse 75   Temp 98.5 F (36.9 C) (Oral)   Ht 5' 5.75" (1.67 m)   Wt 140 lb 6 oz (63.7 kg)   SpO2 98%   BMI 22.83 kg/m    CC: CPE Subjective:   HPI: Sheri Patel is a 73 y.o. female presenting on 07/28/2023 for Annual Exam (MCR prt 2 [AWV- 06/15/23].)   Saw health advisor 05/2023 for medicare wellness visit. Note reviewed.  Recently moved to apartment near [facility].   No results found.  Flowsheet Row Clinical Support from 06/15/2023 in [facility] [place]  PHQ-2 Total Score 0          06/14/2023    7:53 AM 09/19/2022    3:02 PM 05/29/2022    3:09 PM 05/28/2021    2:18 PM 05/16/2020    3:35 PM  Fall Risk   Falls in the past year? 0 0 0 0 0  Number falls in past yr: 0  0  0  Injury with Fall? 0  0  0  Risk for fall due to : No Fall Risks  No Fall Risks  Medication side effect  Follow up Education provided;Falls prevention discussed  Falls prevention discussed;Falls evaluation completed  Falls evaluation completed;Falls prevention discussed  Saw audiologist 09/2022 - mild bilateral SN hearing loss, rec amplification, rpt 2 yrs.   Notes ongoing and worsening anxiety - buspar prescribed last year was not helpful. Stays hypervigilant, anxious to go on subway, train, planes, cars. She benefited from lexapro 10mg  but felt buzz feeling in head.   Preventative: COLONOSCOPY Date: 12/2011 normal ([facility] in [place]) Cologuard 06/2022 - negative  Well woman exam at our office 08/2015 and 03/2019 WNL. Will discontinue cervical cancer screening. Denies pelvic pain, vaginal bleeding, or vaginal/vulvar skin changes [facility]  G3P2  DEXA Date: 09/2012 T -1.9 at spine, -0.7 at hip, was on fosamax for 5 yrs, stopped 10/2013.  DEXA  03/2018 T -1.3 spine. declines repeat for now. Takes calcium and vit D and regular walking.  Lung cancer screening - not eligible Flu shot - yearly  COVID vaccine Pfizer 07/2019, 08/2019, booster 03/2020, 09/2020, bivalent booster 04/2021, again 04/2022 Td 2010, 05/2019 RSV 05/2022 Prevnar-13 09/2016, pneumovax 01/2018 Zostavax - 2012  Shingrix 02/2018, 06/2018  Advanced planning: In chart - HCPOA is husband Richard then Gillis. Would not want prolonged life support if terminal condition (10/2014).  Seat belt use discussed Sunscreen use discussed. No changing moles on skin. Sees derm. Ex smoker - remote  Alcohol - drinking 2 glasses of wine daily - very social. Previously underwent treatment for alcohol use disorder through [facility]  Sleep - averaging 6 hours/night, restless sleep. Has tried melatonin without benefit and ambien with benefit.  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 Interim medical history since  our last visit reviewed. Allergies and medications reviewed and updated. Outpatient Medications Prior to Visit  Medication Sig Dispense Refill   aspirin EC 81 MG tablet Take 1 tablet (81 mg total) by mouth once a week. Swallow whole.     Calcium-Vitamin D-Vitamin K (CALCIUM + D + K PO) Take 1 tablet by mouth daily.     Cyanocobalamin (B-12) 1000 MCG SUBL Place 1 tablet under the tongue every Monday, Wednesday, and Friday. 30 tablet    cycloSPORINE (RESTASIS) 0.05 % ophthalmic emulsion Place 1 drop into both eyes 2 (two) times daily.      psyllium (METAMUCIL SMOOTH TEXTURE) 58.6 % powder Take 1 packet by mouth daily.     amLODipine (NORVASC) 5 MG tablet Take 1 tablet (5 mg total) by mouth daily. 90 tablet 0   hydrochlorothiazide (MICROZIDE) 12.5 MG capsule Take 1 capsule (12.5 mg total) by mouth daily. 90 capsule 0   pravastatin (PRAVACHOL) 40 MG tablet TAKE 1 TABLET(40 MG) BY MOUTH DAILY 90 tablet 0   busPIRone (BUSPAR) 7.5 MG tablet Take 1 tablet (7.5 mg total) by mouth 2 (two) times daily. 60 tablet 4   No facility-administered medications prior to visit.     Per HPI unless specifically indicated in ROS section below Review of Systems  Objective:  BP 132/80   Pulse 75   Temp 98.5 F (36.9 C) (Oral)   Ht 5' 5.75" (1.67 m)   Wt 140 lb 6 oz (63.7 kg)   SpO2 98%   BMI 22.83 kg/m   Wt Readings from Last 3 Encounters:  07/28/23 140 lb 6 oz (63.7 kg)  06/15/23 139 lb (63 kg)  09/19/22 140 lb 4 oz (63.6 kg)      Physical Exam Vitals and nursing note reviewed.  Constitutional:      Appearance: Normal appearance. She is not ill-appearing.  HENT:     Head: Normocephalic and atraumatic.     Right Ear: Tympanic membrane, ear canal and external ear normal. There is no impacted cerumen.     Left Ear: Tympanic membrane, ear canal and external ear normal. There is no impacted cerumen.     Mouth/Throat:     Mouth: Mucous membranes are moist.     Pharynx: Oropharynx is clear. No oropharyngeal exudate or posterior oropharyngeal erythema.  Eyes:     General:        Right eye: No discharge.        Left eye: No discharge.     Extraocular Movements: Extraocular movements intact.     Conjunctiva/sclera: Conjunctivae normal.     Pupils: Pupils are equal, round, and reactive to light.  Neck:     Thyroid: No thyroid mass or thyromegaly.     Vascular: Carotid bruit (presumed radiation from known aortic valve) present.  Cardiovascular:     Rate and Rhythm: Normal rate and regular rhythm.     Pulses: Normal pulses.      Heart sounds: Murmur (3/6 systolic USB) heard.  Pulmonary:     Effort: Pulmonary effort is normal. No respiratory distress.     Breath sounds: Normal breath sounds. No wheezing, rhonchi or rales.  Abdominal:     General: Bowel sounds are normal. There is no distension.     Palpations: Abdomen is soft. There is no mass.     Tenderness: There is no abdominal tenderness. There is no guarding or rebound.     Hernia: No hernia is present.  Musculoskeletal:     Cervical  back: Normal range of motion and neck supple. No rigidity.     Right lower leg: No edema.     Left lower leg: No edema.  Lymphadenopathy:     Cervical: No cervical adenopathy.  Skin:    General: Skin is warm and dry.     Findings: No rash.  Neurological:     General: No focal deficit present.     Mental Status: She is alert. Mental status is at baseline.  Psychiatric:        Mood and Affect: Mood normal.        Behavior: Behavior normal.       Results for orders placed or performed in visit on 07/21/23  Folate   Collection Time: 07/21/23  7:38 AM  Result Value Ref Range   Folate 21.8 >5.9 ng/mL  Vitamin B1   Collection Time: 07/21/23  7:38 AM  Result Value Ref Range   Vitamin B1 (Thiamine) 16 8 - 30 nmol/L  VITAMIN D 25 Hydroxy (Vit-D Deficiency, Fractures)   Collection Time: 07/21/23  7:38 AM  Result Value Ref Range   VITD 30.51 30.00 - 100.00 ng/mL  Vitamin B12   Collection Time: 07/21/23  7:38 AM  Result Value Ref Range   Vitamin B-12 425 211 - 911 pg/mL  Microalbumin / creatinine urine ratio   Collection Time: 07/21/23  7:38 AM  Result Value Ref Range   Microalb, Ur 0.7 0.0 - 1.9 mg/dL   Creatinine,U 82.9 mg/dL   Microalb Creat Ratio 1.1 0.0 - 30.0 mg/g  Comprehensive metabolic panel   Collection Time: 07/21/23  7:38 AM  Result Value Ref Range   Sodium 139 135 - 145 mEq/L   Potassium 3.5 3.5 - 5.1 mEq/L   Chloride 99 96 - 112 mEq/L   CO2 32 19 - 32 mEq/L   Glucose, Bld 107 (H) 70 - 99 mg/dL    BUN 18 6 - 23 mg/dL   Creatinine, Ser 5.62 0.40 - 1.20 mg/dL   Total Bilirubin 0.6 0.2 - 1.2 mg/dL   Alkaline Phosphatase 53 39 - 117 U/L   AST 16 0 - 37 U/L   ALT 18 0 - 35 U/L   Total Protein 7.0 6.0 - 8.3 g/dL   Albumin 4.6 3.5 - 5.2 g/dL   GFR 13.08 >65.78 mL/min   Calcium 9.3 8.4 - 10.5 mg/dL  Lipid panel   Collection Time: 07/21/23  7:38 AM  Result Value Ref Range   Cholesterol 189 0 - 200 mg/dL   Triglycerides 469.6 0.0 - 149.0 mg/dL   HDL 29.52 >84.13 mg/dL   VLDL 24.4 0.0 - 01.0 mg/dL   LDL Cholesterol 272 (H) 0 - 99 mg/dL   Total CHOL/HDL Ratio 3    NonHDL 131.84       06/15/2023    1:04 PM 09/19/2022    3:02 PM 05/29/2022    3:07 PM 05/28/2021    2:18 PM 05/16/2020    3:35 PM  Depression screen PHQ 2/9  Decreased Interest 0 0 0 0 0  Down, Depressed, Hopeless 0 0 0 0 0  PHQ - 2 Score 0 0 0 0 0  Altered sleeping  2 0  0  Tired, decreased energy  1 0  0  Change in appetite  0 0  0  Feeling bad or failure about yourself   0 0  0  Trouble concentrating  0 0  0  Moving slowly or fidgety/restless  0 0  0  Suicidal thoughts  0 0  0  PHQ-9 Score  3 0  0  Difficult doing work/chores  Somewhat difficult Not difficult at all  Not difficult at all       09/19/2022    3:02 PM  GAD 7 : Generalized Anxiety Score  Nervous, Anxious, on Edge 1  Control/stop worrying 0  Worry too much - different things 1  Trouble relaxing 1  Restless 0  Easily annoyed or irritable 0  Afraid - awful might happen 0  Total GAD 7 Score 3  Anxiety Difficulty Somewhat difficult   Assessment & Plan:   Problem List Items Addressed This Visit     Advanced care planning/counseling discussion - Primary (Chronic)   Previously discussed      Hypertension   Chronic, stable on current regimen - continue. Encouraged good dietary potassium intake with thiazide diuretic on board.       Relevant Medications   amLODipine (NORVASC) 5 MG tablet   hydrochlorothiazide (MICROZIDE) 12.5 MG capsule    pravastatin (PRAVACHOL) 40 MG tablet   Hyperlipidemia   Chronic, adequate on pravastatin - continue. The 10-year ASCVD risk score (Arnett DK, et al., 2019) is: 16.2%   Values used to calculate the score:     Age: 58 years     Sex: Female     Is Non-Hispanic African American: No     Diabetic: No     Tobacco smoker: No     Systolic Blood Pressure: 132 mmHg     Is BP treated: Yes     HDL Cholesterol: 57.3 mg/dL     Total Cholesterol: 189 mg/dL       Relevant Medications   amLODipine (NORVASC) 5 MG tablet   hydrochlorothiazide (MICROZIDE) 12.5 MG capsule   pravastatin (PRAVACHOL) 40 MG tablet   Osteopenia   Continue calcium, vit D supplementation, regular walking/weight bearing exercise.  Offered rpt DEXA scan - declined.       Aortic stenosis, mild   Again heard today, mild.       Relevant Medications   amLODipine (NORVASC) 5 MG tablet   hydrochlorothiazide (MICROZIDE) 12.5 MG capsule   pravastatin (PRAVACHOL) 40 MG tablet   History of alcohol dependence (HCC)   Continued social drinking.  Limits to 1-2 glasses wine/day.       Low serum vitamin B12   Levels stable on MWF dosing      GAD (generalized anxiety disorder)   Deterioration noted. Buspar was ineffective. Previously on lexapro 10mg  with good effect - will restart. She did note buzzing in head side effect while on SSRI, will monitor for this.       Relevant Medications   escitalopram (LEXAPRO) 10 MG tablet   Mild sensorineural hearing loss   Mild on audiology evaluation 4.2024- with planned rpt testing in 2 yrs       Insomnia   Chronic, overall restless sleep with sleep maintenance difficulty.  Discussed possible relation of alcohol to sleep quality.  Trial L theanine PRN night time awakenings.  Melatonin ineffective.  Ambien previously helpful.         Meds ordered this encounter  Medications   amLODipine (NORVASC) 5 MG tablet    Sig: Take 1 tablet (5 mg total) by mouth daily.    Dispense:  90  tablet    Refill:  4   hydrochlorothiazide (MICROZIDE) 12.5 MG capsule    Sig: Take 1 capsule (12.5 mg total) by mouth daily.    Dispense:  90 capsule    Refill:  4   pravastatin (PRAVACHOL) 40 MG tablet    Sig: TAKE 1 TABLET(40 MG) BY MOUTH DAILY    Dispense:  90 tablet    Refill:  4   escitalopram (LEXAPRO) 10 MG tablet    Sig: Take 1 tablet (10 mg total) by mouth daily.    Dispense:  90 tablet    Refill:  4    No orders of the defined types were placed in this encounter.   Patient Instructions  Try L-theanine supplement for night time awakenings  Restart lexapro 10mg  daily  Good to see you today Return as needed or in 1 year for next wellness visit/ follow up   Follow up plan: Return in about 1 year (around 07/27/2024) for medicare wellness visit.  Eustaquio Boyden, MD

## 2023-07-28 NOTE — Patient Instructions (Addendum)
Try L-theanine supplement for night time awakenings  Restart lexapro 10mg  daily  Good to see you today Return as needed or in 1 year for next wellness visit/ follow up

## 2023-07-28 NOTE — Assessment & Plan Note (Signed)
Again heard today, mild.

## 2023-07-28 NOTE — Assessment & Plan Note (Addendum)
Mild on audiology evaluation 4.2024- with planned rpt testing in 2 yrs

## 2023-07-28 NOTE — Assessment & Plan Note (Addendum)
Continue calcium, vit D supplementation, regular walking/weight bearing exercise.  Offered rpt DEXA scan - declined.

## 2023-07-28 NOTE — Assessment & Plan Note (Signed)
Chronic, overall restless sleep with sleep maintenance difficulty.  Discussed possible relation of alcohol to sleep quality.  Trial L theanine PRN night time awakenings.  Melatonin ineffective.  Ambien previously helpful.

## 2023-07-28 NOTE — Assessment & Plan Note (Signed)
Deterioration noted. Buspar was ineffective. Previously on lexapro 10mg  with good effect - will restart. She did note buzzing in head side effect while on SSRI, will monitor for this.

## 2023-07-28 NOTE — Assessment & Plan Note (Signed)
Previously discussed.

## 2023-07-28 NOTE — Assessment & Plan Note (Signed)
Chronic, adequate on pravastatin - continue. The 10-year ASCVD risk score (Arnett DK, et al., 2019) is: 16.2%   Values used to calculate the score:     Age: 73 years     Sex: Female     Is Non-Hispanic African American: No     Diabetic: No     Tobacco smoker: No     Systolic Blood Pressure: 132 mmHg     Is BP treated: Yes     HDL Cholesterol: 57.3 mg/dL     Total Cholesterol: 189 mg/dL

## 2023-07-28 NOTE — Assessment & Plan Note (Addendum)
Chronic, stable on current regimen - continue. Encouraged good dietary potassium intake with thiazide diuretic on board.

## 2023-07-29 ENCOUNTER — Other Ambulatory Visit (HOSPITAL_COMMUNITY)
Admission: RE | Admit: 2023-07-29 | Discharge: 2023-07-29 | Disposition: A | Discharge status: Home / Self Care | Payer: Self-pay | Source: Ambulatory Visit | Attending: Medical Genetics | Admitting: Medical Genetics

## 2023-07-30 ENCOUNTER — Encounter: Payer: Self-pay | Admitting: Family Medicine

## 2023-08-09 LAB — GENECONNECT MOLECULAR SCREEN: Genetic Analysis Overall Interpretation: NEGATIVE

## 2023-10-25 ENCOUNTER — Encounter: Payer: Self-pay | Admitting: Family Medicine

## 2023-10-26 NOTE — Telephone Encounter (Signed)
 Updated pt's chart.

## 2024-04-23 ENCOUNTER — Encounter: Payer: Self-pay | Admitting: Family Medicine

## 2024-05-31 DIAGNOSIS — H02834 Dermatochalasis of left upper eyelid: Secondary | ICD-10-CM | POA: Diagnosis not present

## 2024-05-31 DIAGNOSIS — H02412 Mechanical ptosis of left eyelid: Secondary | ICD-10-CM | POA: Diagnosis not present

## 2024-05-31 DIAGNOSIS — H2513 Age-related nuclear cataract, bilateral: Secondary | ICD-10-CM | POA: Diagnosis not present

## 2024-05-31 DIAGNOSIS — H02831 Dermatochalasis of right upper eyelid: Secondary | ICD-10-CM | POA: Diagnosis not present

## 2024-05-31 DIAGNOSIS — H18593 Other hereditary corneal dystrophies, bilateral: Secondary | ICD-10-CM | POA: Diagnosis not present

## 2024-05-31 DIAGNOSIS — D3132 Benign neoplasm of left choroid: Secondary | ICD-10-CM | POA: Diagnosis not present

## 2024-05-31 DIAGNOSIS — H43813 Vitreous degeneration, bilateral: Secondary | ICD-10-CM | POA: Diagnosis not present

## 2024-06-16 ENCOUNTER — Encounter

## 2024-07-04 ENCOUNTER — Other Ambulatory Visit: Payer: Self-pay | Admitting: Family Medicine

## 2024-07-04 DIAGNOSIS — Z1231 Encounter for screening mammogram for malignant neoplasm of breast: Secondary | ICD-10-CM

## 2024-07-21 ENCOUNTER — Ambulatory Visit
Admission: RE | Admit: 2024-07-21 | Discharge: 2024-07-21 | Disposition: A | Discharge status: Home / Self Care | Source: Ambulatory Visit

## 2024-07-21 DIAGNOSIS — Z1231 Encounter for screening mammogram for malignant neoplasm of breast: Secondary | ICD-10-CM

## 2024-07-25 ENCOUNTER — Other Ambulatory Visit: Payer: Self-pay | Admitting: Family Medicine

## 2024-07-25 DIAGNOSIS — E538 Deficiency of other specified B group vitamins: Secondary | ICD-10-CM

## 2024-07-25 DIAGNOSIS — M8588 Other specified disorders of bone density and structure, other site: Secondary | ICD-10-CM

## 2024-07-25 DIAGNOSIS — E782 Mixed hyperlipidemia: Secondary | ICD-10-CM

## 2024-07-26 ENCOUNTER — Ambulatory Visit: Payer: Self-pay | Admitting: Family Medicine

## 2024-07-26 ENCOUNTER — Other Ambulatory Visit (INDEPENDENT_AMBULATORY_CARE_PROVIDER_SITE_OTHER): Payer: Medicare Other

## 2024-07-26 ENCOUNTER — Ambulatory Visit

## 2024-07-26 VITALS — Ht 65.75 in | Wt 140.0 lb

## 2024-07-26 DIAGNOSIS — E538 Deficiency of other specified B group vitamins: Secondary | ICD-10-CM | POA: Diagnosis not present

## 2024-07-26 DIAGNOSIS — E782 Mixed hyperlipidemia: Secondary | ICD-10-CM | POA: Diagnosis not present

## 2024-07-26 DIAGNOSIS — Z0000 Encounter for general adult medical examination without abnormal findings: Secondary | ICD-10-CM | POA: Diagnosis not present

## 2024-07-26 DIAGNOSIS — M8588 Other specified disorders of bone density and structure, other site: Secondary | ICD-10-CM

## 2024-07-26 DIAGNOSIS — Z1231 Encounter for screening mammogram for malignant neoplasm of breast: Secondary | ICD-10-CM

## 2024-07-26 LAB — LIPID PANEL
Cholesterol: 174 mg/dL (ref 28–200)
HDL: 46.6 mg/dL
LDL Cholesterol: 93 mg/dL (ref 10–99)
NonHDL: 127
Total CHOL/HDL Ratio: 4
Triglycerides: 170 mg/dL — ABNORMAL HIGH (ref 10.0–149.0)
VLDL: 34 mg/dL (ref 0.0–40.0)

## 2024-07-26 LAB — COMPREHENSIVE METABOLIC PANEL WITH GFR
ALT: 18 U/L (ref 3–35)
AST: 15 U/L (ref 5–37)
Albumin: 4.7 g/dL (ref 3.5–5.2)
Alkaline Phosphatase: 57 U/L (ref 39–117)
BUN: 13 mg/dL (ref 6–23)
CO2: 30 meq/L (ref 19–32)
Calcium: 9.6 mg/dL (ref 8.4–10.5)
Chloride: 97 meq/L (ref 96–112)
Creatinine, Ser: 0.73 mg/dL (ref 0.40–1.20)
GFR: 81.54 mL/min
Glucose, Bld: 100 mg/dL — ABNORMAL HIGH (ref 70–99)
Potassium: 4.1 meq/L (ref 3.5–5.1)
Sodium: 136 meq/L (ref 135–145)
Total Bilirubin: 0.6 mg/dL (ref 0.2–1.2)
Total Protein: 7.2 g/dL (ref 6.0–8.3)

## 2024-07-26 LAB — VITAMIN D 25 HYDROXY (VIT D DEFICIENCY, FRACTURES): VITD: 36.16 ng/mL (ref 30.00–100.00)

## 2024-07-26 LAB — VITAMIN B12: Vitamin B-12: 407 pg/mL (ref 211–911)

## 2024-07-26 NOTE — Progress Notes (Signed)
 "  Chief Complaint  Patient presents with   Medicare Wellness     Subjective:   Sheri Patel is a 74 y.o. female who presents for a Medicare Annual Wellness Visit.  Visit info / Clinical Intake: Medicare Wellness Visit Type:: Subsequent Annual Wellness Visit Persons participating in visit and providing information:: patient Medicare Wellness Visit Mode:: Telephone If telephone:: video declined Since this visit was completed virtually, some vitals may be partially provided or unavailable. Missing vitals are due to the limitations of the virtual format.: Unable to obtain vitals - no equipment If Telephone or Video please confirm:: I connected with patient using audio/video enable telemedicine. I verified patient identity with two identifiers, discussed telehealth limitations, and patient agreed to proceed. Patient Location:: home Provider Location:: home office Interpreter Needed?: No Pre-visit prep was completed: yes AWV questionnaire completed by patient prior to visit?: yes Date:: 07/22/24 Living arrangements:: (Patient-Rptd) lives with spouse/significant other Patient's Overall Health Status Rating: (Patient-Rptd) good Typical amount of pain: (Patient-Rptd) some Does pain affect daily life?: (Patient-Rptd) no Are you currently prescribed opioids?: no  Dietary Habits and Nutritional Risks How many meals a day?: (Patient-Rptd) 3 Eats fruit and vegetables daily?: (Patient-Rptd) yes Most meals are obtained by: (Patient-Rptd) preparing own meals In the last 2 weeks, have you had any of the following?: none Diabetic:: no  Functional Status Activities of Daily Living (to include ambulation/medication): (Patient-Rptd) Independent Ambulation: (Patient-Rptd) Independent Medication Administration: (Patient-Rptd) Independent Home Management (perform basic housework or laundry): (Patient-Rptd) Independent Manage your own finances?: (Patient-Rptd) yes Primary transportation is:  (Patient-Rptd) driving Concerns about vision?: no *vision screening is required for WTM* Concerns about hearing?: no  Fall Screening Falls in the past year?: (Patient-Rptd) 0 Number of falls in past year: 0 Was there an injury with Fall?: 0 Fall Risk Category Calculator: 0 Patient Fall Risk Level: Low Fall Risk  Fall Risk Patient at Risk for Falls Due to: No Fall Risks Fall risk Follow up: Falls evaluation completed; Education provided; Falls prevention discussed  Home and Transportation Safety: All rugs have non-skid backing?: (Patient-Rptd) yes All stairs or steps have railings?: (Patient-Rptd) yes Grab bars in the bathtub or shower?: (Patient-Rptd) yes Have non-skid surface in bathtub or shower?: (Patient-Rptd) yes Good home lighting?: (Patient-Rptd) yes Regular seat belt use?: (Patient-Rptd) yes Hospital stays in the last year:: (Patient-Rptd) no  Cognitive Assessment Difficulty concentrating, remembering, or making decisions? : (Patient-Rptd) no Will 6CIT or Mini Cog be Completed: yes What year is it?: 0 points What month is it?: 0 points Give patient an address phrase to remember (5 components): [address] [place]  About what time is it?: 0 points Count backwards from 20 to 1: 0 points Say the months of the year in reverse: 0 points Repeat the address phrase from earlier: 0 points 6 CIT Score: 0 points  Advance Directives (For Healthcare) Does Patient Have a Medical Advance Directive?: Yes Does patient want to make changes to medical advance directive?: No - Patient declined Type of Advance Directive: Healthcare Power of [place]; Living will Copy of Healthcare Power of Attorney in Chart?: No - copy requested Copy of Living Will in Chart?: No - copy requested  Reviewed/Updated  Reviewed/Updated: Reviewed All (Medical, Surgical, Family, Medications, Allergies, Care Teams, Patient Goals)    Allergies (verified) Patient has no known allergies.    Current Medications (verified) Outpatient Encounter Medications as of 07/26/2024  Medication Sig   amLODipine  (NORVASC ) 5 MG tablet Take 1 tablet (5 mg total) by mouth  daily.   aspirin  EC 81 MG tablet Take 1 tablet (81 mg total) by mouth once a week. Swallow whole.   Calcium-Vitamin D -Vitamin K (CALCIUM + D + K PO) Take 1 tablet by mouth daily.   Cyanocobalamin  (B-12) 1000 MCG SUBL Place 1 tablet under the tongue every Monday, Wednesday, and Friday.   cycloSPORINE (RESTASIS) 0.05 % ophthalmic emulsion Place 1 drop into both eyes 2 (two) times daily.   hydrochlorothiazide  (MICROZIDE ) 12.5 MG capsule Take 1 capsule (12.5 mg total) by mouth daily.   pravastatin  (PRAVACHOL ) 40 MG tablet TAKE 1 TABLET(40 MG) BY MOUTH DAILY   psyllium (METAMUCIL SMOOTH TEXTURE) 58.6 % powder Take 1 packet by mouth daily.   escitalopram  (LEXAPRO ) 10 MG tablet Take 1 tablet (10 mg total) by mouth daily.   No facility-administered encounter medications on file as of 07/26/2024.    History: Past Medical History:  Diagnosis Date   Anxiety 2023   Arthritis    hands, off meds   Atrophic vaginitis 08/29/2014   by prior OBGYN   Heart murmur 2017?   Dr. KANDICE. diagnosed   History of migraine    resolved after menopause   Hyperlipidemia    Hypertension    Osteopenia 09/28/2012   DEXA T-1.9 at spine, consider rpt in 3-5 yrs   Osteoporosis 2004?   Osteopenia   Situational depression 07/23/2009   after brother died (lung cancer)   Ventricular ectopy    benign, s/p cards eval   Past Surgical History:  Procedure Laterality Date   COLONOSCOPY  12/29/2011   normal ([facility] in [place])   DEXA  09/28/2012   T -1.9 at spine, -0.7 at hip   EYE SURGERY  1999   Lasik   TONSILLECTOMY  July 23, 1964   Family History  Problem Relation Age of Onset   Alcohol abuse Mother    Arthritis Mother    Hyperlipidemia Mother    CAD Mother 51       MI   Hypertension Mother    Heart disease Mother    Hyperlipidemia Father     CAD Father 27       MI   Diabetes Father    Hearing loss Father    Heart disease Father    Hypertension Father    Alcohol abuse Brother    Cancer Brother        lung (smoker)   CAD Brother 46       MI   Hyperlipidemia Brother    Hypertension Brother    Mental illness Brother    Diabetes Brother    Asthma Brother    Depression Brother    Early death Brother    Heart disease Brother    Hypertension Sister    Arthritis Maternal Grandmother    Vision loss Maternal Grandmother    Early death Maternal Grandfather    Heart disease Maternal Grandfather    Depression Sister    Hypertension Sister    Heart disease Paternal Grandfather    Heart disease Paternal Grandmother    Stroke Neg Hx    Social History   Occupational History   Not on file  Tobacco Use   Smoking status: Former    Current packs/day: 0.00    Average packs/day: 0.5 packs/day for 10.0 years (5.0 ttl pk-yrs)    Types: Cigarettes    Quit date: 10/28/1976    Years since quitting: 47.7   Smokeless tobacco: Never  Vaping Use   Vaping status: Never Used  Substance  and Sexual Activity   Alcohol use: Yes    Alcohol/week: 14.0 standard drinks of alcohol    Types: 14 Glasses of wine per week    Comment: Controlled (0 - 2 glasses of wine/day)   Drug use: Never   Sexual activity: Yes    Birth control/protection: Post-menopausal   Tobacco Counseling Counseling given: Not Answered  SDOH Screenings   Food Insecurity: No Food Insecurity (07/22/2024)  Housing: Low Risk (07/22/2024)  Transportation Needs: No Transportation Needs (07/22/2024)  Utilities: Not At Risk (07/26/2024)  Alcohol Screen: Medium Risk (07/24/2023)  Depression (PHQ2-9): Low Risk (07/26/2024)  Financial Resource Strain: Low Risk (07/22/2024)  Physical Activity: Sufficiently Active (07/26/2024)  Social Connections: Socially Integrated (07/26/2024)  Stress: No Stress Concern Present (07/26/2024)  Tobacco Use: Medium Risk (07/26/2024)  Health Literacy:  Adequate Health Literacy (07/26/2024)   See flowsheets for full screening details  Depression Screen PHQ 2 & 9 Depression Scale- Over the past 2 weeks, how often have you been bothered by any of the following problems? Little interest or pleasure in doing things: 0 Feeling down, depressed, or hopeless (PHQ Adolescent also includes...irritable): 0 PHQ-2 Total Score: 0     Goals Addressed             This Visit's Progress    I want to do more strength exercises               Objective:    Today's Vitals   07/26/24 1455  Weight: 140 lb (63.5 kg)  Height: 5' 5.75 (1.67 m)   Body mass index is 22.77 kg/m.  Hearing/Vision screen No results found. Immunizations and Health Maintenance Health Maintenance  Topic Date Due   Medicare Annual Wellness (AWV)  06/14/2024   Mammogram  07/15/2024   COVID-19 Vaccine (10 - 2025-26 season) 10/14/2024   Fecal DNA (Cologuard)  07/28/2025   DTaP/Tdap/Td (3 - Tdap) 05/03/2029   Pneumococcal Vaccine: 50+ Years  Completed   Influenza Vaccine  Completed   Bone Density Scan  Completed   Hepatitis C Screening  Completed   Zoster Vaccines- Shingrix  Completed   Meningococcal B Vaccine  Aged Out   Colonoscopy  Discontinued        Assessment/Plan:  This is a routine wellness examination for Amador Pines.  Patient Care Team: Rilla Baller, MD as PCP - General (Family Medicine) Octavia Bruckner, MD as Consulting Physician (Ophthalmology)  I have personally reviewed and noted the following in the patients chart:   Medical and social history Use of alcohol, tobacco or illicit drugs  Current medications and supplements including opioid prescriptions. Functional ability and status Nutritional status Physical activity Advanced directives List of other physicians Hospitalizations, surgeries, and ER visits in previous 12 months Vitals Screenings to include cognitive, depression, and falls Referrals and appointments  No orders of  the defined types were placed in this encounter.  In addition, I have reviewed and discussed with patient certain preventive protocols, quality metrics, and best practice recommendations. A written personalized care plan for preventive services as well as general preventive health recommendations were provided to patient.   Sheri LITTIE Saris, LPN   [id number]/72/7973   No follow-ups on file.  After Visit Summary: (MyChart) Due to this being a telephonic visit, the after visit summary with patients personalized plan was offered to patient via MyChart   Nurse Notes: No voiced or noted concerns at this time Patient advised to keep follow-up appointment with PCP (March 2026) HM Addressed: Mammogram ordered pt indicates she  leaves Sunday for trip to [place] for 3 weeks..will schedule when rtns  "

## 2024-07-26 NOTE — Patient Instructions (Signed)
 Ms. Josey,  Thank you for taking the time for your Medicare Wellness Visit. I appreciate your continued commitment to your health goals. Please review the care plan we discussed, and feel free to reach out if I can assist you further.  Please note that Annual Wellness Visits do not include a physical exam. Some assessments may be limited, especially if the visit was conducted virtually. If needed, we may recommend an in-person follow-up with your provider.  Ongoing Care Seeing your primary care provider every 3 to 6 months helps us  monitor your health and provide consistent, personalized care.   Referrals If a referral was made during today's visit and you haven't received any updates within two weeks, please contact the referred provider directly to check on the status.  Recommended Screenings:  Health Maintenance  Topic Date Due   Medicare Annual Wellness Visit  06/14/2024   Breast Cancer Screening  07/15/2024   COVID-19 Vaccine (10 - 2025-26 season) 10/14/2024   Cologuard (Stool DNA test)  07/28/2025   DTaP/Tdap/Td vaccine (3 - Tdap) 05/03/2029   Pneumococcal Vaccine for age over 68  Completed   Flu Shot  Completed   Osteoporosis screening with Bone Density Scan  Completed   Hepatitis C Screening  Completed   Zoster (Shingles) Vaccine  Completed   Meningitis B Vaccine  Aged Out   Colon Cancer Screening  Discontinued       07/22/2024    4:30 PM  Advanced Directives  Does Patient Have a Medical Advance Directive? Yes  Type of Estate Agent of [place];Living will  Does patient want to make changes to medical advance directive? No - Patient declined  Copy of Healthcare Power of Attorney in Chart? No - copy requested    Vision: Annual vision screenings are recommended for early detection of glaucoma, cataracts, and diabetic retinopathy. These exams can also reveal signs of chronic conditions such as diabetes and high blood pressure.  Dental: Annual  dental screenings help detect early signs of oral cancer, gum disease, and other conditions linked to overall health, including heart disease and diabetes.  Please see the attached documents for additional preventive care recommendations.

## 2024-07-30 ENCOUNTER — Ambulatory Visit: Payer: Self-pay | Admitting: Family Medicine

## 2024-08-02 ENCOUNTER — Encounter: Payer: Medicare Other | Admitting: Family Medicine

## 2024-09-06 ENCOUNTER — Ambulatory Visit: Admitting: Family Medicine

## 2024-09-06 ENCOUNTER — Ambulatory Visit
